# Patient Record
Sex: Female | Born: 1990 | Race: Black or African American | Hispanic: No | Marital: Single | Smoking: Never smoker
Health system: Southern US, Community
[De-identification: ages and names within clinical notes are randomized; demographics above are authoritative.]

## PROBLEM LIST (undated history)

## (undated) DIAGNOSIS — L989 Disorder of the skin and subcutaneous tissue, unspecified: Secondary | ICD-10-CM

## (undated) DIAGNOSIS — Z8619 Personal history of other infectious and parasitic diseases: Secondary | ICD-10-CM

## (undated) DIAGNOSIS — L309 Dermatitis, unspecified: Secondary | ICD-10-CM

## (undated) HISTORY — PX: WISDOM TOOTH EXTRACTION: SHX21

## (undated) HISTORY — DX: Dermatitis, unspecified: L30.9

## (undated) HISTORY — DX: Personal history of other infectious and parasitic diseases: Z86.19

---

## 2009-05-21 ENCOUNTER — Emergency Department (HOSPITAL_COMMUNITY): Admission: EM | Admit: 2009-05-21 | Discharge: 2009-05-21 | Payer: Self-pay | Admitting: Emergency Medicine

## 2012-04-26 ENCOUNTER — Encounter (HOSPITAL_COMMUNITY): Payer: Self-pay | Admitting: *Deleted

## 2012-04-26 ENCOUNTER — Emergency Department (HOSPITAL_COMMUNITY)
Admission: EM | Admit: 2012-04-26 | Discharge: 2012-04-26 | Discharge status: Against Medical Advice | Payer: Self-pay | Attending: Emergency Medicine | Admitting: Emergency Medicine

## 2012-04-26 DIAGNOSIS — R109 Unspecified abdominal pain: Secondary | ICD-10-CM | POA: Insufficient documentation

## 2012-04-26 LAB — URINALYSIS, ROUTINE W REFLEX MICROSCOPIC
Glucose, UA: NEGATIVE mg/dL
Nitrite: NEGATIVE
Specific Gravity, Urine: 1.016 (ref 1.005–1.030)
pH: 6 (ref 5.0–8.0)

## 2012-04-26 LAB — URINE MICROSCOPIC-ADD ON

## 2012-04-26 NOTE — ED Notes (Signed)
Patient not in waiting room  

## 2012-04-26 NOTE — ED Notes (Addendum)
Pt states she woke up with right sided upper abdominal pain. Pt states that when she laughs, coughs pain gets worse. Pt states she able to have bowel movement and no problems with urinate. Pt state denies pain in back just abdomen. Pt also requesting STI testing.

## 2012-04-26 NOTE — ED Notes (Signed)
Patient not in waiting room at this time.

## 2012-04-27 NOTE — ED Provider Notes (Signed)
Pt left [facility]a prior to my evaluation  Olivia Mackie, MD 04/27/12 [phone]

## 2012-07-07 ENCOUNTER — Emergency Department (HOSPITAL_COMMUNITY)
Admission: EM | Admit: 2012-07-07 | Discharge: 2012-07-07 | Disposition: A | Discharge status: Home / Self Care | Payer: Self-pay | Attending: Emergency Medicine | Admitting: Emergency Medicine

## 2012-07-07 ENCOUNTER — Encounter (HOSPITAL_COMMUNITY): Payer: Self-pay

## 2012-07-07 DIAGNOSIS — M436 Torticollis: Secondary | ICD-10-CM | POA: Insufficient documentation

## 2012-07-07 DIAGNOSIS — Z825 Family history of asthma and other chronic lower respiratory diseases: Secondary | ICD-10-CM | POA: Insufficient documentation

## 2012-07-07 HISTORY — DX: Disorder of the skin and subcutaneous tissue, unspecified: L98.9

## 2012-07-07 MED ORDER — METHOCARBAMOL 500 MG PO TABS: 1000.0000 mg | ORAL_TABLET | Freq: Four times a day (QID) | ORAL | Status: DC

## 2012-07-07 MED ORDER — IBUPROFEN 800 MG PO TABS: 800.0000 mg | ORAL_TABLET | Freq: Three times a day (TID) | ORAL | Status: DC | PRN

## 2012-07-07 NOTE — ED Provider Notes (Signed)
History     CSN: [id number]  Arrival date & time 07/07/12  1727   First MD Initiated Contact with Patient 07/07/12 2037      Chief Complaint  Patient presents with  . Neck Pain  . Back Pain    (Consider location/radiation/quality/duration/timing/severity/associated sxs/prior treatment) HPI Comments: Patient with gradual onset of neck stiffness yesterday that has gradually worsened today. Patient denies injury at the onset. Patient is a Therapist, art and lifts patients. Patient's mother gave her a Flexeril and Lidoderm patch which has not helped. She's also been taking ibuprofen and using heat packs. Patient denies fever. She is able to look left and right with stiffness. She complains of slight headache. No vision changes. No nausea or vomiting.  The history is provided by the patient.    Past Medical History  Diagnosis Date  . Skin disease     History reviewed. No pertinent past surgical history.  Family History  Problem Relation Age of Onset  . Asthma Mother     History  Substance Use Topics  . Smoking status: Never Smoker   . Smokeless tobacco: Never Used  . Alcohol Use: Yes     1-2 times a week    OB History    Grav Para Term Preterm Abortions TAB SAB Ect Mult Living                  Review of Systems  Constitutional: Negative for fever.  HENT: Positive for neck pain and neck stiffness. Negative for sore throat.   Eyes: Negative for visual disturbance.  Gastrointestinal: Negative for nausea and vomiting.  Skin: Negative for rash.  Neurological: Positive for headaches.    Allergies  Review of patient's allergies indicates no known allergies.  Home Medications   Current Outpatient Rx  Name Route Sig Dispense Refill  . CYCLOBENZAPRINE HCL 10 MG PO TABS Oral Take 10 mg by mouth once.    . IBUPROFEN 200 MG PO TABS Oral Take 400 mg by mouth every 6 (six) hours as needed. Pain.      BP 115/74  Pulse 95  Temp 98.5 F (36.9 C) (Oral)  Resp 16  SpO2 100%   LMP 06/18/2012  Physical Exam  Nursing note and vitals reviewed. Constitutional: She appears well-developed and well-nourished.  HENT:  Head: Normocephalic and atraumatic.  Eyes: Conjunctivae normal are normal. Pupils are equal, round, and reactive to light.  Neck: Neck supple.       Mild decrease in lateral rotation, bending, flexion/extension. No meningeal signs.   Pulmonary/Chest: Effort normal.  Abdominal: Soft. There is no tenderness. There is no CVA tenderness.  Musculoskeletal: Normal range of motion.       Cervical back: She exhibits tenderness. She exhibits no bony tenderness.       Thoracic back: She exhibits no bony tenderness.       Back:       No step-off noted with palpation of spine.   Neurological: She is alert. She has normal strength and normal reflexes. No sensory deficit.       5/5 strength in entire lower extremities bilaterally. No sensation deficit.   Skin: Skin is warm and dry. No rash noted.  Psychiatric: She has a normal mood and affect.    ED Course  Procedures (including critical care time)  Labs Reviewed - No data to display No results found.   1. Torticollis     8:38 PM Patient seen and examined.    Vital signs reviewed  and are as follows: Filed Vitals:   07/07/12 1822  BP: 115/74  Pulse: 95  Temp: 98.5 F (36.9 C)  Resp: 16   Counseled on use of ibuprofen.  Patient counseled on proper use of muscle relaxant medication.  They were told not to drink alcohol, drive any vehicle, or do any dangerous activities while taking this medication.  Patient verbalized understanding.    MDM  Patient with neck strain and spasm in neck and upper back. Patient is nontoxic, no fever, no indications of meningitis. Conservative management indicated. No neurological deficits and upper extremities.        Renne Crigler, [place] 07/07/12 2057

## 2012-07-07 NOTE — ED Notes (Signed)
Patient reports that she began having posterior neck, upper back and shoulder pain yesterday. Patient has taken Ibuprofen, Lidoderm patch, and a heat pack and a flexeril with no relief. Patient now c/o slight headache.

## 2012-07-07 NOTE — ED Notes (Signed)
[place], PA at bedside.  

## 2012-07-07 NOTE — ED Notes (Signed)
Family at bedside. 

## 2012-07-08 NOTE — ED Provider Notes (Signed)
Medical screening examination/treatment/procedure(s) were performed by non-physician practitioner and as supervising physician I was immediately available for consultation/collaboration.  Jones Skene, M.D.     Jones Skene, MD 07/08/12 [phone]

## 2013-01-07 ENCOUNTER — Ambulatory Visit (INDEPENDENT_AMBULATORY_CARE_PROVIDER_SITE_OTHER): Payer: Managed Care, Other (non HMO) | Admitting: Obstetrics & Gynecology

## 2013-01-07 ENCOUNTER — Encounter: Payer: Self-pay | Admitting: Obstetrics & Gynecology

## 2013-01-07 VITALS — BP 111/74 | HR 74 | Temp 97.9°F | Ht 65.0 in | Wt 142.0 lb

## 2013-01-07 DIAGNOSIS — Z3009 Encounter for other general counseling and advice on contraception: Secondary | ICD-10-CM | POA: Insufficient documentation

## 2013-01-07 DIAGNOSIS — R350 Frequency of micturition: Secondary | ICD-10-CM

## 2013-01-07 DIAGNOSIS — N760 Acute vaginitis: Secondary | ICD-10-CM

## 2013-01-07 DIAGNOSIS — N912 Amenorrhea, unspecified: Secondary | ICD-10-CM

## 2013-01-07 LAB — POCT URINALYSIS DIPSTICK
Bilirubin, UA: NEGATIVE
Ketones, UA: NEGATIVE
Protein, UA: NEGATIVE
Spec Grav, UA: 1.015
pH, UA: 6

## 2013-01-07 LAB — POCT WET PREP (WET MOUNT)

## 2013-01-07 MED ORDER — METRONIDAZOLE 500 MG PO TABS: 500.0000 mg | ORAL_TABLET | Freq: Two times a day (BID) | ORAL | Status: DC

## 2013-01-07 MED ORDER — NORETHINDRONE ACET-ETHINYL EST 1-20 MG-MCG PO TABS: 1.0000 | ORAL_TABLET | Freq: Every day | ORAL | Status: DC

## 2013-01-07 NOTE — Progress Notes (Signed)
Subjective:     Monique Harrington is a 22 y.o. female here for a routine exam.  Current complaints: malodorous vaginal discharge.  Personal health questionnaire reviewed: no.   Gynecologic History Patient's last menstrual period was 11/26/2012. Contraception: none  Obstetric History OB History   Grav Para Term Preterm Abortions TAB SAB Ect Mult Living                   The following portions of the patient's history were reviewed and updated as appropriate: allergies, current medications, past family history, past medical history, past social history, past surgical history and problem list.  Review of Systems Pertinent items are noted in HPI.    Objective:    Pelvic: cervix normal in appearance, external genitalia normal, no adnexal masses or tenderness, uterus normal size, shape, and consistency and thin white vaginal discharge    Assessment:   Bacterial vaginosis    Plan:    Follow up in: several months.   Start COCP

## 2013-01-07 NOTE — Patient Instructions (Signed)
Patient information: Abnormal uterine bleeding (Beyond the Basics)  Author Collene Leyden, MD Section Editor Lysbeth Galas, MD Deputy Editor Morton Amy, MD Disclosures  All topics are updated as new evidence becomes available and our peer review process is complete.  Literature review current through: Mar 2014.  This topic last updated: Nov 11, 2012.  INTRODUCTION - The inside of the uterus has two layers. The thin inner layer is called the endometrium. The thick outer layer is the myometrium (myo = muscle) (figure 1). Menstruation occurs 10 to 14 days after ovulation. In women who ovulate and menstruate regularly, the endometrium thickens every month in preparation for pregnancy. If the woman does not become pregnant, the endometrial lining is shed during the menstrual period. After menopause, the lining normally stops growing and shedding. Under normal circumstances, a woman's uterus sheds a limited amount of blood during each menstrual period (less than 5 tablespoons or 80 mL). Bleeding that occurs erratically or excessive regular menstrual bleeding is considered to be abnormal uterine bleeding. Once a woman who is not taking hormone therapy enters menopause and the menstrual cycles have ended, any uterine bleeding is considered abnormal. Abnormal uterine bleeding can be caused by many different conditions. This topic review discusses the possible causes of abnormal bleeding, how it is evaluated, and various treatment strategies that may be recommended. CAUSES OF ABNORMAL UTERINE BLEEDING - Most conditions that cause abnormal uterine bleeding can occur at any age, but some are more likely to occur at a particular time in a woman's life. Abnormal uterine bleeding in young girls - Bleeding before menarche (the first period in a girl's life) is always abnormal. It may be caused by trauma, a foreign body (such as toys, coins, or toilet tissue), irritation of the genital area (due to bubble  bath, soaps, lotions, or infection), or urinary tract problems. Bleeding can also occur as a result of sexual abuse. Adolescents - Many girls have episodes of irregular bleeding during the first few months after their first menstrual period. This usually resolves without treatment when the girl's hormonal cycle and ovulation normalizes. If bleeding persists beyond this time, or if the bleeding is heavy, further evaluation is needed. Abnormal bleeding in this age group can also be caused by any of the conditions that cause bleeding in all premenopausal women, including: pregnancy, infection, and bleeding disorder or other medical illnesses. These and other causes are discussed in the next section. Premenopausal women - Many different conditions can cause abnormal bleeding in women between adolescence and menopause. Abrupt changes in hormone levels at the time of ovulation can cause vaginal spotting, or small amounts of bleeding. Breakthrough bleeding can also occur in premenopausal women who use hormonal birth control methods. Some women do not ovulate regularly and may experience unpredictable light or heavy vaginal bleeding. Although anovulation is most common when periods first begin and during perimenopause, it can occur at any time during the reproductive years. (See "Patient information: Absent or irregular periods (Beyond the Basics)".) Some women who ovulate normally experience excessive blood loss during their periods or bleed between periods. The most common causes of such bleeding are uterine fibroids, uterine adenomyosis, or endometrial polyps. Fibroids are benign masses in the muscle layer of the uterus (myometrium), while adenomyosis is a condition in which the lining of the uterus (endometrium) grows into the myometrium. Endometrial polyps are fleshy (usually benign) growths of tissue which project into the uterine cavity. These conditions are common causes of abnormal uterine bleeding.  Fibroids,  adenomyosis and polyps can also occur in anovulatory women. (See "Patient information: Uterine fibroids (Beyond the Basics)" and "Patient information: Heavy or prolonged periods (menorrhagia) (Beyond the Basics)".) Other causes of abnormal uterine bleeding in premenopausal women include: ?Pregnancy ?Cancer or precancer of the cervix or the endometrium (lining of the uterus) (see "Patient information: Endometrial cancer diagnosis and staging (Beyond the Basics)") ?Infection or inflammation of the cervix or endometrium ?Clotting disorders such as von Willebrand disease, platelet abnormalities, or problems with clotting factors ?Medical illnesses such as hypothyroidism, liver disease, or chronic renal disease Hormonal birth control - Girls and women who use hormonal birth control (eg, pills, ring, patch) may experience "breakthrough" bleeding between periods. If this occurs during the first few months, it may be due to changes in the lining of the uterus. If it persists for more than a few months, evaluation may be needed and/or a different birth control pill may be recommended. (See "Patient information: Hormonal methods of birth control (Beyond the Basics)".) Breakthrough bleeding can also happen if a hormonal birth control method is forgotten or taken late. In this situation, there is a risk that the woman could become pregnant if she has sex. Another form of birth control (eg, condoms) is recommended if the pill/patch/shot is not taken on time. Women in the menopausal transition - Before the menstrual periods end, a woman passes through a period called the menopausal transition or perimenopause. During the menopausal transition, normal hormonal cycling begins to change and ovulation may be inconsistent. While estrogen secretion continues, progesterone secretion declines. These hormonal changes can cause the endometrium to grow and produce excess tissue, increasing the chances that polyps or endometrial  hyperplasia (thickened lining of the uterus) will develop and potentially cause abnormal bleeding. The menopausal transition is a time when women are more likely to experience abnormal uterine bleeding. Women in the menopausal transition are also at risk for other conditions that cause abnormal bleeding, including cancer, infection, and body-wide (systemic) illnesses. Further evaluation is needed in women with persistent irregular menstrual cycles or an episode of profuse bleeding. Women in the menopausal transition still ovulate some of the time and can become pregnant; pregnancy can cause abnormal bleeding. In addition, women in perimenopause may use hormonal birth control medications, which can cause breakthrough bleeding. Menopausal women - A number of conditions can cause abnormal bleeding during the menopause. Women who take hormone therapy may experience cyclical bleeding. Any other bleeding that occurs during menopause is abnormal and should be investigated. Causes of abnormal bleeding during menopause include: ?Atrophy (excessive thinning) of the tissue lining the vagina and uterus ?Cancer or precancerous changes (hyperplasia) of the uterine lining (endometrium) (see "Patient information: Endometrial cancer diagnosis and staging (Beyond the Basics)") ?Polyps or fibroids ?Infection of the uterus ?Use of blood thinners or anticoagulants ?Side effects of radiation therapy ABNORMAL UTERINE BLEEDING EVALUATION Initial assessment - While taking a woman's medical history, a clinician will review the duration and amount of bleeding; factors that seem to bring the bleeding on; symptoms that occur along with the bleeding such as pain, fever, or vaginal odor; if bleeding occurs after sexual intercourse; whether there is a personal or family history of bleeding disorders; the woman's medical history and medications she is taking; recent weight changes, stress, a new exercise program, or underlying medical  problems. The clinician will perform a physical examination to evaluate the woman's overall health, and a pelvic examination to confirm that the bleeding is from the uterus and not from  another site (eg, the external genitals or rectum). During the pelvic exam, the clinician will look for any obvious lesions (cuts, sores, or tumors) and will examine the size and shape of the uterus. He or she will examine the cervix to look for signs of cervical bleeding, and a Pap smear/human papillomavirus (HPV) test may be obtained to screen for cervical cancer (the cervix is at the lower end of the uterus, where it opens to the vagina). (See "Patient information: Cervical cancer screening (Beyond the Basics)".) Lab tests - In premenopausal women, a pregnancy test is performed. If there is any abnormal vaginal discharge, a cervical culture may be performed. Blood tests may also be done to determine if there are problems with blood clotting or other body-wide conditions, such as thyroid disease, liver disease, or kidney problems. Tests to determine ovulatory status - Because hormonal irregularities can contribute to abnormal uterine bleeding, testing may be recommended to determine if the woman ovulates (produce an egg) during each monthly cycle. Endometrial assessment - Tests that assess the endometrium (lining of the uterus) may be performed to rule out endometrial cancer and structural abnormalities such as uterine fibroids or polyps. Such tests include: Endometrial biopsy - An endometrial biopsy is often performed in women age 31 or older to rule out endometrial cancer or abnormal endometrial growths. A biopsy may also be performed in women younger than 45 years if they have risk factors for endometrial cancer or are felt to be at increased risk for an infection of the endometrium. Risks for endometrial cancer include obesity, chronic anovulation, history of breast cancer, tamoxifen use or a family history of breast  cancer or colon cancer. (See "Patient information: Endometrial cancer diagnosis and staging (Beyond the Basics)".) During the biopsy, a thin instrument is inserted through the vagina into the uterus to obtain a small sample of endometrial tissue. The biopsy (which often causes temporary severe uterine cramping) can be performed in a healthcare provider's office without anesthesia. Because only a small portion of the endometrium is sampled, the biopsy may miss some causes of bleeding and other tests are sometimes necessary. Transvaginal ultrasound - An ultrasound uses sound waves to measure an organ's shape and structure. In a transvaginal ultrasound, an ultrasound probe is inserted into the vagina so that it is closer to the uterus and can provide a clear image of the uterus. The lining of the uterus is evaluated and measured; postmenopausal women normally have a very thin endometrial lining (usually less than 4 or 5 mm). Ultrasound cannot distinguish between different types of abnormalities (eg, polyp versus cancer) and further testing may be necessary. Saline infusion sonography (sonohysterography) - In this test, a transvaginal ultrasound is performed after sterile saline is instilled into the uterus. This procedure gives a better picture of the inside of the uterus, and small lesions can be more easily detected. However, because tissue samples cannot be obtained during the procedure, a final diagnosis is not always possible and additional evaluation, usually including hysteroscopy with dilation and curettage (D&C) may be necessary. Hysteroscopy - During hysteroscopy, a small scope is inserted through the cervix and into the uterus. Air or fluid is injected to expand the uterus and to allow the physician to see the inside of the uterus. Tissue samples may be taken. Anesthesia may be used to minimize discomfort during the procedure. Hysteroscopy may be performed in the office or in a same-day surgery in an  operating room. Dilation and curettage (D&C) - In a D&C, the  cervix or opening of the uterus is dilated and instruments are inserted and used to remove endometrial or uterine tissue. A D&C usually requires anesthesia. It can sometimes be used as a treatment for prolonged or excessive bleeding that is due to hormonal changes and that is unresponsive to other treatments. (See "Patient information: Dilation and curettage (D and C) (Beyond the Basics)".) ABNORMAL UTERINE BLEEDING TREATMENT - The treatment of abnormal bleeding is based upon the underlying cause. Birth control pills - Birth control pills are often used to treat uterine bleeding that is due to hormonal changes or hormonal irregularities. Birth control pills may be used in women who do not ovulate regularly to establish regular bleeding cycles and prevent excessive growth of the endometrium. In women who do ovulate, they may be used to treat excessive menstrual bleeding. Nonsteroidal anti-inflammatory drugs (NSAIDS, eg ibuprofen, naproxen sodium) may also be helpful in reducing blood loss and cramping in these women. During the menopausal transition, birth control pills or other hormonal therapy may be used to regulate the menstrual cycle and prevent excessive growth of the endometrium. (See "Patient information: Heavy or prolonged periods (menorrhagia) (Beyond the Basics)".) Progesterone - Progesterone is a hormone made by the ovary that is effective in preventing excessive bleeding in women who do not ovulate regularly. A synthetic form of progesterone, called progestin, may be recommended to treat abnormal bleeding. Progestins are usually given as pills (eg, medroxyprogesterone acetate, norethindrone), and are taken once a day for 10 to 12 days each month or two, or taken continuously (every day). In women taking monthly cyclical progestin therapy, vaginal bleeding will begin before the seventh day of progestin treatment if the uterine lining is  overgrown; otherwise, it may not be seen until several days after the last progestin tablet is taken. In some cases, the progestin is given on a regular basis (eg, every few months) to prevent excessive growth of the uterine lining and heavy menstrual bleeding. If no bleeding is seen after progestin treatment, the possibility of an unintended pregnancy should be explored. Progestins may also be given in other ways, such as in an injection, an implant, or an intrauterine device. These treatments are discussed in detail in a separate topic review. (See "Patient information: Heavy or prolonged periods (menorrhagia) (Beyond the Basics)".) Intrauterine device - An intrauterine contraceptive device (IUD) that secretes progestin (eg, Mirena) may be recommended for women who have abnormal uterine bleeding. IUDs are T-shaped devices inserted by a healthcare provider through the vagina and cervix into the uterus. IUDs include an attached plastic string that projects through the cervix, enabling the woman to check that the device remains in place (picture 1). Progestin-releasing IUDs decrease menstrual blood loss by 40 to 50 percent and decrease pain associated with periods. Some women completely stop having menstrual bleeding as a result of the IUD, which is reversible when the IUD is removed. (See "Patient information: Long-term methods of birth control (Beyond the Basics)".) Surgery - Surgery may be necessary to remove abnormal uterine structures (eg, fibroids, polyps). Women who have completed childbearing and have heavy menstrual bleeding can consider a surgical procedure such as endometrial ablation. This procedure may be performed in a gynecologist's office or in an operating room as a same-day surgery, and uses heat, cold, or a laser to destroy the lining of the uterus. More information about endometrial ablation is available in a separate topic review. (See "Patient information: Heavy or prolonged periods  (menorrhagia) (Beyond the Basics)".) Women with fibroids can have  surgical treatment of their fibroids, either by removing the fibroid(s) (eg, myomectomy) or by reducing the blood supply of the fibroids (eg, uterine artery embolization). The most definitive surgical treatment for abnormal uterine bleeding is hysterectomy, or removal of the entire uterus. At the time of hysterectomy, the ovaries may be left in place or removed. Hysterectomy may be performed by conventional laparoscopy or robotic laparoscopy ("belly button surgery"), through the vagina, or by an open incision on the abdomen. More information about these treatments is available separately. (See "Patient information: Uterine fibroids (Beyond the Basics)".) WHERE TO GET MORE INFORMATION - Your healthcare provider is the best source of information for questions and concerns related to your medical problem.

## 2013-03-23 ENCOUNTER — Other Ambulatory Visit: Payer: Self-pay | Admitting: Occupational Medicine

## 2013-03-23 ENCOUNTER — Ambulatory Visit
Admission: RE | Admit: 2013-03-23 | Discharge: 2013-03-23 | Disposition: A | Discharge status: Home / Self Care | Payer: No Typology Code available for payment source | Source: Ambulatory Visit | Attending: Occupational Medicine | Admitting: Occupational Medicine

## 2013-03-23 DIAGNOSIS — Z021 Encounter for pre-employment examination: Secondary | ICD-10-CM

## 2013-04-06 ENCOUNTER — Ambulatory Visit: Payer: Self-pay | Admitting: Obstetrics & Gynecology

## 2013-04-08 ENCOUNTER — Ambulatory Visit: Payer: Managed Care, Other (non HMO) | Admitting: Obstetrics & Gynecology

## 2013-09-30 ENCOUNTER — Emergency Department (HOSPITAL_COMMUNITY)
Admission: EM | Admit: 2013-09-30 | Discharge: 2013-09-30 | Disposition: A | Discharge status: Home / Self Care | Payer: 59 | Source: Home / Self Care | Attending: Family Medicine | Admitting: Family Medicine

## 2013-09-30 ENCOUNTER — Encounter (HOSPITAL_COMMUNITY): Payer: Self-pay | Admitting: Emergency Medicine

## 2013-09-30 DIAGNOSIS — J039 Acute tonsillitis, unspecified: Secondary | ICD-10-CM

## 2013-09-30 MED ORDER — AMOXICILLIN-POT CLAVULANATE 875-125 MG PO TABS: 1.0000 | ORAL_TABLET | Freq: Two times a day (BID) | ORAL | Status: DC

## 2013-09-30 MED ORDER — FLUCONAZOLE 150 MG PO TABS: 150.0000 mg | ORAL_TABLET | Freq: Once | ORAL | Status: DC

## 2013-09-30 NOTE — ED Provider Notes (Signed)
Monique Harrington is a 22 y.o. female who presents to Urgent Care today for sore throat for the past 4 or 5 days. This is associated with left ear pain and left-sided cervical neck pain. No nausea vomiting diarrhea. No trouble breathing or chest pain. Patient had initial fever. She's tried multiple over-the-counter medications which will help a little. She feels well otherwise. She notes that with antibiotic she typically develops yeast infections.   Past Medical History  Diagnosis Date  . Skin disease   . Eczema   . History of chlamydia    History  Substance Use Topics  . Smoking status: Never Smoker   . Smokeless tobacco: Never Used  . Alcohol Use: Yes     Comment: 1-2 times a week   ROS as above Medications reviewed. No current facility-administered medications for this encounter.   Current Outpatient Prescriptions  Medication Sig Dispense Refill  . amoxicillin-clavulanate (AUGMENTIN) 875-125 MG per tablet Take 1 tablet by mouth every 12 (twelve) hours.  14 tablet  0  . fluconazole (DIFLUCAN) 150 MG tablet Take 1 tablet (150 mg total) by mouth once.  1 tablet  1  . norethindrone-ethinyl estradiol (MICROGESTIN,JUNEL,LOESTRIN) 1-20 MG-MCG tablet Take 1 tablet by mouth daily. Start in 3 weeks  1 Package  11    Exam:  BP 124/81  Pulse 78  Temp(Src) 98.2 F (36.8 C) (Oral)  Resp 18  SpO2 98%  LMP 08/27/2013 Gen: Well NAD HEENT: EOMI,  MMM, left tonsil hypertrophy erythema with exudate. Right is normal. Tympanic membranes are normal appearing bilaterally. Patient has mildly tender left-sided anterior cervical lymphadenopathy. Neck range of motion is normal Lungs: Normal work of breathing. CTABL Heart: RRR no MRG Exts: warm and well perfused.   Results for orders placed during the hospital encounter of 09/30/13 (from the past 24 hour(s))  POCT RAPID STREP A ([facility] ONLY)     Status: Abnormal   Collection Time    09/30/13  6:04 PM      Result Value Range   Streptococcus,  Group A Screen (Direct) POSITIVE (*) NEGATIVE   No results found.  Assessment and Plan: 22 y.o. female with tonsillitis. Plan to treat with Augmentin. Additionally recommend NSAIDs for pain control. I have prescribed fluconazole case patient develops yeast infection. Discussed warning signs or symptoms. Please see discharge instructions. Patient expresses understanding.      Rodolph Bong, MD 09/30/13 [phone]

## 2013-09-30 NOTE — ED Notes (Signed)
C/o cold sx States she has a sore throat States last week she has had a fever Pain radiating to ear.

## 2013-10-01 ENCOUNTER — Emergency Department (HOSPITAL_COMMUNITY)
Admission: EM | Admit: 2013-10-01 | Discharge: 2013-10-01 | Disposition: A | Discharge status: Home / Self Care | Payer: 59 | Attending: Emergency Medicine | Admitting: Emergency Medicine

## 2013-10-01 ENCOUNTER — Emergency Department (HOSPITAL_COMMUNITY): Payer: 59

## 2013-10-01 ENCOUNTER — Encounter (HOSPITAL_COMMUNITY): Payer: Self-pay | Admitting: Emergency Medicine

## 2013-10-01 DIAGNOSIS — Z872 Personal history of diseases of the skin and subcutaneous tissue: Secondary | ICD-10-CM | POA: Insufficient documentation

## 2013-10-01 DIAGNOSIS — Z792 Long term (current) use of antibiotics: Secondary | ICD-10-CM | POA: Insufficient documentation

## 2013-10-01 DIAGNOSIS — Z79899 Other long term (current) drug therapy: Secondary | ICD-10-CM | POA: Insufficient documentation

## 2013-10-01 DIAGNOSIS — Z8619 Personal history of other infectious and parasitic diseases: Secondary | ICD-10-CM | POA: Insufficient documentation

## 2013-10-01 DIAGNOSIS — J36 Peritonsillar abscess: Secondary | ICD-10-CM | POA: Insufficient documentation

## 2013-10-01 LAB — POCT I-STAT, CHEM 8
BUN: 8 mg/dL (ref 6–23)
CALCIUM ION: 1.25 mmol/L — AB (ref 1.12–1.23)
CHLORIDE: 102 meq/L (ref 96–112)
Creatinine, Ser: 0.9 mg/dL (ref 0.50–1.10)
Glucose, Bld: 88 mg/dL (ref 70–99)
HEMATOCRIT: 42 % (ref 36.0–46.0)
Hemoglobin: 14.3 g/dL (ref 12.0–15.0)
Potassium: 3.8 mEq/L (ref 3.7–5.3)
Sodium: 140 mEq/L (ref 137–147)
TCO2: 24 mmol/L (ref 0–100)

## 2013-10-01 MED ORDER — IOHEXOL 300 MG/ML  SOLN
75.0000 mL | Freq: Once | INTRAMUSCULAR | Status: AC | PRN
  Administered 2013-10-01: 75 mL via INTRAVENOUS

## 2013-10-01 MED ORDER — MORPHINE SULFATE 4 MG/ML IJ SOLN
4.0000 mg | Freq: Once | INTRAMUSCULAR | Status: AC
  Administered 2013-10-01: 4 mg via INTRAVENOUS
  Filled 2013-10-01: qty 1

## 2013-10-01 MED ORDER — ONDANSETRON HCL 4 MG/2ML IJ SOLN
4.0000 mg | Freq: Once | INTRAMUSCULAR | Status: AC
  Administered 2013-10-01: 4 mg via INTRAVENOUS
  Filled 2013-10-01: qty 2

## 2013-10-01 MED ORDER — HYDROMORPHONE HCL PF 1 MG/ML IJ SOLN
1.0000 mg | Freq: Once | INTRAMUSCULAR | Status: AC
  Administered 2013-10-01: 1 mg via INTRAVENOUS
  Filled 2013-10-01: qty 1

## 2013-10-01 MED ORDER — DEXAMETHASONE SODIUM PHOSPHATE 10 MG/ML IJ SOLN
10.0000 mg | Freq: Once | INTRAMUSCULAR | Status: AC
  Administered 2013-10-01: 10 mg via INTRAVENOUS
  Filled 2013-10-01: qty 1

## 2013-10-01 MED ORDER — HYDROCODONE-ACETAMINOPHEN 7.5-325 MG/15ML PO SOLN: 15.0000 mL | Freq: Four times a day (QID) | ORAL | Status: DC | PRN

## 2013-10-01 MED ORDER — KETOROLAC TROMETHAMINE 30 MG/ML IJ SOLN
INTRAMUSCULAR | Status: AC
  Filled 2013-10-01: qty 1

## 2013-10-01 MED ORDER — KETOROLAC TROMETHAMINE 30 MG/ML IJ SOLN
30.0000 mg | Freq: Once | INTRAMUSCULAR | Status: AC
  Administered 2013-10-01: 30 mg via INTRAVENOUS
  Filled 2013-10-01: qty 1

## 2013-10-01 MED ORDER — SODIUM CHLORIDE 0.9 % IV BOLUS (SEPSIS)
1000.0000 mL | Freq: Once | INTRAVENOUS | Status: AC
  Administered 2013-10-01: 1000 mL via INTRAVENOUS

## 2013-10-01 NOTE — ED Notes (Signed)
Discussed discharge orders and persription with patient, pt. Understands. D/C home with family.

## 2013-10-01 NOTE — Discharge Instructions (Signed)
Please read and follow all provided instructions.  Your diagnoses today include:  1. Peritonsillar abscess     Tests performed today include:  CT scan shows 1.3cm x 0.9cm abscess next to your left tonsil.  Vital signs. See below for your results today.   Medications prescribed:   Lortab (hydrocodone/acetaminophen) - narcotic pain medication  DO NOT drive or perform any activities that require you to be awake and alert because this medicine can make you drowsy. BE VERY CAREFUL not to take multiple medicines containing Tylenol (also called acetaminophen). Doing so can lead to an overdose which can damage your liver and cause liver failure and possibly death.  Take any medications prescribed only as directed.   Home care instructions:  Please read the educational materials provided and follow any instructions contained in this packet.  Follow-up instructions: Please the throat doctor's office tomorrow morning at 9am. Tell them you were in the Emergency Department and have a peritonsillar abscess that needs drained. I spoke with Dr. Jenne PaneBates on the telephone today.   If you do not have a primary care doctor -- see below for referral information.   Return instructions:   Please return to the Emergency Department if you experience worsening symptoms.   Return if you have worsening problems swallowing, your neck becomes swollen, you cannot swallow your saliva or your voice becomes muffled.   Return with high persistent fever, persistent vomiting, or if you have trouble breathing.   Please return if you have any other emergent concerns.  Additional Information:  Your vital signs today were: BP 127/74   Pulse 63   Temp(Src) 98.3 F (36.8 C) (Oral)   Resp 14   SpO2 98%   LMP 08/27/2013 If your blood pressure (BP) was elevated above 135/85 this visit, please have this repeated by your doctor within one month. --------------

## 2013-10-01 NOTE — ED Provider Notes (Signed)
CSN: [id number]     Arrival date & time 10/01/13  [id number] History   First MD Initiated Contact with Patient 10/01/13 [phone]     Chief Complaint  Patient presents with  . Sore Throat   (Consider location/radiation/quality/duration/timing/severity/associated sxs/prior Treatment) HPI Comments: Patient presents with complaint of sore throat for the past 6 days. Yesterday her symptoms became more severe, more focused on the left side of her throat. Patient went to urgent care and was started on Augmentin. She returns today with severe pain. She had a fever at the onset but this resolves. She ate cheeseburger yesterday and is able to drink fluids. No other URI symptoms or cough. No other treatments prior to arrival. Onset of symptoms gradual. Course is worsening. Nothing makes symptoms better. Swallowing makes pain worse.  Patient is a 23 y.o. female presenting with pharyngitis. The history is provided by the patient, medical records and a parent.  Sore Throat Associated symptoms include a sore throat. Pertinent negatives include no abdominal pain, chills, congestion, coughing, fatigue, fever, headaches, myalgias, nausea, rash or vomiting.    Past Medical History  Diagnosis Date  . Skin disease   . Eczema   . History of chlamydia    Past Surgical History  Procedure Laterality Date  . Wisdom tooth extraction     Family History  Problem Relation Age of Onset  . Asthma Mother    History  Substance Use Topics  . Smoking status: Never Smoker   . Smokeless tobacco: Never Used  . Alcohol Use: Yes     Comment: 1-2 times a week   OB History   Grav Para Term Preterm Abortions TAB SAB Ect Mult Living                 Review of Systems  Constitutional: Negative for fever, chills and fatigue.  HENT: Positive for sore throat. Negative for congestion, ear pain, facial swelling, rhinorrhea and sinus pressure.   Eyes: Negative for redness.  Respiratory: Negative for cough and wheezing.    Gastrointestinal: Negative for nausea, vomiting, abdominal pain and diarrhea.  Genitourinary: Negative for dysuria.  Musculoskeletal: Negative for myalgias and neck stiffness.  Skin: Negative for rash.  Neurological: Negative for headaches.  Hematological: Negative for adenopathy.    Allergies  Review of patient's allergies indicates no known allergies.  Home Medications   Current Outpatient Rx  Name  Route  Sig  Dispense  Refill  . amoxicillin-clavulanate (AUGMENTIN) 875-125 MG per tablet   Oral   Take 1 tablet by mouth every 12 (twelve) hours.   14 tablet   0   . fluconazole (DIFLUCAN) 150 MG tablet   Oral   Take 1 tablet (150 mg total) by mouth once.   1 tablet   1   . norethindrone-ethinyl estradiol (MICROGESTIN,JUNEL,LOESTRIN) 1-20 MG-MCG tablet   Oral   Take 1 tablet by mouth daily. Start in 3 weeks   1 Package   11    BP 124/76  Pulse 78  Temp(Src) 98.3 F (36.8 C) (Oral)  Resp 16  SpO2 100%  LMP 08/27/2013 Physical Exam  Nursing note and vitals reviewed. Constitutional: She appears well-developed and well-nourished.  HENT:  Head: Normocephalic and atraumatic.  Right Ear: Hearing, tympanic membrane and ear canal normal.  Left Ear: Hearing, tympanic membrane and ear canal normal.  Nose: Nose normal.  Mouth/Throat: Mucous membranes are not dry. No trismus in the jaw. Tonsillar abscesses (left, early) present. No oropharyngeal exudate, posterior oropharyngeal edema or posterior  oropharyngeal erythema.  Eyes: Conjunctivae are normal. Right eye exhibits no discharge. Left eye exhibits no discharge.  Muffled voice  Neck: Normal range of motion. Neck supple.  Cardiovascular: Normal rate, regular rhythm and normal heart sounds.   Pulmonary/Chest: Effort normal and breath sounds normal.  Abdominal: Soft. There is no tenderness.  Lymphadenopathy:    She has cervical adenopathy.  Neurological: She is alert.  Skin: Skin is warm and dry.  Psychiatric: She has a  normal mood and affect.    ED Course  Procedures (including critical care time) Labs Review Labs Reviewed  POCT I-STAT, CHEM 8 - Abnormal; Notable for the following:    Calcium, Ion 1.25 (*)    All other components within normal limits   Imaging Review Ct Soft Tissue Neck W Contrast  10/01/2013   CLINICAL DATA:  Evaluate left peritonsillar abscess. Sore throat and fever with difficulty swallowing for 1 week.  EXAM: CT NECK WITH CONTRAST  TECHNIQUE: Multidetector CT imaging of the neck was performed using the standard protocol following the bolus administration of intravenous contrast.  CONTRAST:  75mL OMNIPAQUE IOHEXOL 300 MG/ML  SOLN  COMPARISON:  None.  FINDINGS: Orbits and sinuses are within normal. The spaces of the suprahyoid neck are notable for prominence of the peritonsillar tissues left worse than right with small left peritonsillar fluid collection/abscess measuring 1.3 x 0.9 cm in its AP and transverse dimension. There is mild reactive left cervical chain adenopathy with the largest node measuring 1.5 cm by short axis. Airways patent. There is subtle asymmetric enlargement of the left submandibular gland. Spaces of the infrahyoid neck are within normal. Visualize lungs are normal. The remainder of the exam is unremarkable.  IMPRESSION: Prominence of the peritonsillar soft tissues left greater than right with small focal fluid collection within the left peritonsillar tissues measuring 1.3 x 0.9 cm likely a small peritonsillar abscess. Mild associated reactive adenopathy of the left cervical chain. No evidence of airway compromise.   Electronically Signed   By: Elberta Fortis M.D.   On: 10/01/2013 11:24    EKG Interpretation   None      8:04 AM Patient seen and examined. Work-up initiated. Medications ordered. Suspect early PTA. Will CT neck to eval L PTA.   Vital signs reviewed and are as follows: Filed Vitals:   10/01/13 0747  BP: 124/76  Pulse: 78  Temp: 98.3 F (36.8 C)   Resp: 16   12:21 PM CT reviewed by myself. Spoke with Dr. Jenne Pane. Reccs decadron, continue Augmentin, have patient call office at [facility] tomorrow and have drainage performed in office tomorrow.   Patient and family updated. Additional pain meds prior to d/c.   Patient urged to return with worsening symptoms or other concerns. Patient verbalized understanding and agrees with plan.   Patient counseled on use of narcotic pain medications. Counseled not to combine these medications with others containing tylenol. Urged not to drink alcohol, drive, or perform any other activities that requires focus while taking these medications. The patient verbalizes understanding and agrees with the plan.  MDM   1. Peritonsillar abscess    Patient with L PTA, no airway compromise. Plan drainage tomorrow. She has been on Augmentin x 1 day. Steroids, pain control given.     Renne Crigler, PA-C 10/01/13 1223

## 2013-10-01 NOTE — ED Provider Notes (Signed)
Medical screening examination/treatment/procedure(s) were performed by non-physician practitioner and as supervising physician I was immediately available for consultation/collaboration.  EKG Interpretation   None         Charles B. Bernette MayersSheldon, MD 10/01/13 1226

## 2013-10-01 NOTE — ED Notes (Signed)
[facility]. [facility] and diagnosed with tonsillitis. Stated to EMS that it is hard to swallow and all she wants to do is spit. Lungs clear and rates pain 10/10. BP-110/80 HR-68

## 2014-02-15 ENCOUNTER — Ambulatory Visit: Payer: Managed Care, Other (non HMO) | Admitting: Obstetrics & Gynecology

## 2014-02-15 ENCOUNTER — Ambulatory Visit (INDEPENDENT_AMBULATORY_CARE_PROVIDER_SITE_OTHER): Payer: 59 | Admitting: Obstetrics & Gynecology

## 2014-02-15 ENCOUNTER — Encounter: Payer: Self-pay | Admitting: Obstetrics & Gynecology

## 2014-02-15 VITALS — BP 126/74 | HR 74 | Temp 98.4°F | Wt 138.0 lb

## 2014-02-15 DIAGNOSIS — Z0000 Encounter for general adult medical examination without abnormal findings: Secondary | ICD-10-CM

## 2014-02-15 DIAGNOSIS — Z01419 Encounter for gynecological examination (general) (routine) without abnormal findings: Secondary | ICD-10-CM

## 2014-02-15 DIAGNOSIS — IMO0001 Reserved for inherently not codable concepts without codable children: Secondary | ICD-10-CM

## 2014-02-15 MED ORDER — LEVONORGESTREL-ETHINYL ESTRAD 0.15-30 MG-MCG PO TABS: 1.0000 | ORAL_TABLET | Freq: Every day | ORAL | Status: AC

## 2014-02-15 NOTE — Patient Instructions (Signed)
Contraception Choices Contraception (birth control) is the use of any methods or devices to prevent pregnancy. Below are some methods to help avoid pregnancy. HORMONAL METHODS   Contraceptive implant This is a thin, plastic tube containing progesterone hormone. It does not contain estrogen hormone. Your health care provider inserts the tube in the inner part of the upper arm. The tube can remain in place for up to 3 years. After 3 years, the implant must be removed. The implant prevents the ovaries from releasing an egg (ovulation), thickens the cervical mucus to prevent sperm from entering the uterus, and thins the lining of the inside of the uterus.  Progesterone-only injections These injections are given every 3 months by your health care provider to prevent pregnancy. This synthetic progesterone hormone stops the ovaries from releasing eggs. It also thickens cervical mucus and changes the uterine lining. This makes it harder for sperm to survive in the uterus.  Birth control pills These pills contain estrogen and progesterone hormone. They work by preventing the ovaries from releasing eggs (ovulation). They also cause the cervical mucus to thicken, preventing the sperm from entering the uterus. Birth control pills are prescribed by a health care provider.Birth control pills can also be used to treat heavy periods.  Minipill This type of birth control pill contains only the progesterone hormone. They are taken every day of each month and must be prescribed by your health care provider.  Birth control patch The patch contains hormones similar to those in birth control pills. It must be changed once a week and is prescribed by a health care provider.  Vaginal ring The ring contains hormones similar to those in birth control pills. It is left in the vagina for 3 weeks, removed for 1 week, and then a new one is put back in place. The patient must be comfortable inserting and removing the ring from the  vagina.A health care provider's prescription is necessary.  Emergency contraception Emergency contraceptives prevent pregnancy after unprotected sexual intercourse. This pill can be taken right after sex or up to 5 days after unprotected sex. It is most effective the sooner you take the pills after having sexual intercourse. Most emergency contraceptive pills are available without a prescription. Check with your pharmacist. Do not use emergency contraception as your only form of birth control. BARRIER METHODS   Female condom This is a thin sheath (latex or rubber) that is worn over the penis during sexual intercourse. It can be used with spermicide to increase effectiveness.  Female condom. This is a soft, loose-fitting sheath that is put into the vagina before sexual intercourse.  Diaphragm This is a soft, latex, dome-shaped barrier that must be fitted by a health care provider. It is inserted into the vagina, along with a spermicidal jelly. It is inserted before intercourse. The diaphragm should be left in the vagina for 6 to 8 hours after intercourse.  Cervical cap This is a round, soft, latex or plastic cup that fits over the cervix and must be fitted by a health care provider. The cap can be left in place for up to 48 hours after intercourse.  Sponge This is a soft, circular piece of polyurethane foam. The sponge has spermicide in it. It is inserted into the vagina after wetting it and before sexual intercourse.  Spermicides These are chemicals that kill or block sperm from entering the cervix and uterus. They come in the form of creams, jellies, suppositories, foam, or tablets. They do not require a   prescription. They are inserted into the vagina with an applicator before having sexual intercourse. The process must be repeated every time you have sexual intercourse. INTRAUTERINE CONTRACEPTION  Intrauterine device (IUD) This is a T-shaped device that is put in a woman's uterus during a  menstrual period to prevent pregnancy. There are 2 types:  Copper IUD This type of IUD is wrapped in copper wire and is placed inside the uterus. Copper makes the uterus and fallopian tubes produce a fluid that kills sperm. It can stay in place for 10 years.  Hormone IUD This type of IUD contains the hormone progestin (synthetic progesterone). The hormone thickens the cervical mucus and prevents sperm from entering the uterus, and it also thins the uterine lining to prevent implantation of a fertilized egg. The hormone can weaken or kill the sperm that get into the uterus. It can stay in place for 3 5 years, depending on which type of IUD is used. PERMANENT METHODS OF CONTRACEPTION  Female tubal ligation This is when the woman's fallopian tubes are surgically sealed, tied, or blocked to prevent the egg from traveling to the uterus.  Hysteroscopic sterilization This involves placing a small coil or insert into each fallopian tube. Your doctor uses a technique called hysteroscopy to do the procedure. The device causes scar tissue to form. This results in permanent blockage of the fallopian tubes, so the sperm cannot fertilize the egg. It takes about 3 months after the procedure for the tubes to become blocked. You must use another form of birth control for these 3 months.  Female sterilization This is when the female has the tubes that carry sperm tied off (vasectomy).This blocks sperm from entering the vagina during sexual intercourse. After the procedure, the man can still ejaculate fluid (semen). NATURAL PLANNING METHODS  Natural family planning This is not having sexual intercourse or using a barrier method (condom, diaphragm, cervical cap) on days the woman could become pregnant.  Calendar method This is keeping track of the length of each menstrual cycle and identifying when you are fertile.  Ovulation method This is avoiding sexual intercourse during ovulation.  Symptothermal method This is  avoiding sexual intercourse during ovulation, using a thermometer and ovulation symptoms.  Post ovulation method This is timing sexual intercourse after you have ovulated. Regardless of which type or method of contraception you choose, it is important that you use condoms to protect against the transmission of sexually transmitted infections (STIs). Talk with your health care provider about which form of contraception is most appropriate for you. Document Released: 09/17/2005 Document Revised: 05/20/2013 Document Reviewed: 03/12/2013 [employer] Patient Information 2014 [employer].  

## 2014-02-15 NOTE — Progress Notes (Signed)
Subjective:     Monique Harrington is a 23 y.o. female here for a routine exam.   Personal health questionnaire:  Is patient Ashkenazi Jewish, have a family history of breast and/or ovarian cancer: no Is there a family history of uterine cancer diagnosed at age < 1950, gastrointestinal cancer, urinary tract cancer, family member who is a Personnel officerLynch syndrome-associated carrier: no Is the patient overweight and hypertensive, family history of diabetes, personal history of gestational diabetes or PCOS: no Is patient over 5955, have PCOS,  family history of premature CHD under age 565, diabetes, smoke, have hypertension or peripheral artery disease:  no   Gynecologic History Patient's last menstrual period was 01/27/2014. Contraception: none Last Pap: 2 yrs ago. Results were: normal   Obstetric History OB History  Gravida Para Term Preterm AB SAB TAB Ectopic Multiple Living  0 0 0 0 0 0 0 0 0 0         Past Medical History  Diagnosis Date  . Skin disease   . Eczema   . History of chlamydia     Past Surgical History  Procedure Laterality Date  . Wisdom tooth extraction      Current outpatient prescriptions:levonorgestrel-ethinyl estradiol (NORDETTE) 0.15-30 MG-MCG tablet, Take 1 tablet by mouth daily., Disp: 1 Package, Rfl: 11 No Known Allergies  History  Substance Use Topics  . Smoking status: Never Smoker   . Smokeless tobacco: Never Used  . Alcohol Use: Yes     Comment: 1-2 times a week    Family History  Problem Relation Age of Onset  . Asthma Mother       Review of Systems  Constitutional: negative for fatigue and weight loss Respiratory: negative for cough and wheezing Cardiovascular: negative for chest pain, fatigue and palpitations Gastrointestinal: negative for abdominal pain and change in bowel habits Musculoskeletal:negative for myalgias Neurological: negative for gait problems and tremors Behavioral/Psych: negative for abusive relationship, depression Endocrine:  negative for temperature intolerance   Genitourinary:negative for abnormal menstrual periods, genital lesions, hot flashes, sexual problems and vaginal discharge Integument/breast: negative for breast lump, breast tenderness, nipple discharge and skin lesion(s)    Objective:       BP 126/74  Pulse 74  Temp(Src) 98.4 F (36.9 C)  Wt 62.596 kg (138 lb)  LMP 01/27/2014 General:   alert  Skin:   no rash or abnormalities  Lungs:   clear to auscultation bilaterally  Heart:   regular rate and rhythm, S1, S2 normal, no murmur, click, rub or gallop  Breasts:   normal without suspicious masses, skin or nipple changes or axillary nodes  Abdomen:  normal findings: no organomegaly, soft, non-tender and no hernia  Pelvis:  External genitalia: normal general appearance Urinary system: urethral meatus normal and bladder without fullness, nontender Vaginal: normal without tenderness, induration or masses Cervix: normal appearance Adnexa: normal bimanual exam Uterus: anteverted and non-tender, normal size   Lab Review  Labs reviewed no Radiologic studies reviewed no     Assessment:    Healthy female exam.    Plan:    Education reviewed: depression evaluation, low fat, low cholesterol diet, safe sex/STD prevention and weight bearing exercise.  Considering a LARC Meds ordered this encounter  Medications  . DISCONTD: hydrOXYzine (ATARAX/VISTARIL) 10 MG tablet    Sig: Take 10 mg by mouth 3 (three) times daily as needed.  Marland Kitchen. levonorgestrel-ethinyl estradiol (NORDETTE) 0.15-30 MG-MCG tablet    Sig: Take 1 tablet by mouth daily.    Dispense:  1 Package  Refill:  11   Orders Placed This Encounter  Procedures  . HIV antibody  . RPR    Follow up as needed.

## 2014-02-16 LAB — HIV ANTIBODY (ROUTINE TESTING W REFLEX): HIV: NONREACTIVE

## 2014-02-16 LAB — RPR

## 2014-02-17 LAB — PAP IG AND CT-NG NAA
CHLAMYDIA PROBE AMP: NEGATIVE
GC PROBE AMP: NEGATIVE

## 2014-03-05 ENCOUNTER — Encounter: Payer: Self-pay | Admitting: *Deleted

## 2014-03-06 IMAGING — CT CT NECK W/ CM
4 of 6 series · 16 of 33 positions shown, 18 images · IV contrast (APPLIED)
Comparison: None.

CLINICAL DATA: Evaluate left peritonsillar abscess. Sore throat and
fever with difficulty swallowing for 1 week.

EXAM:
CT NECK WITH CONTRAST
TECHNIQUE: Multidetector CT imaging of the neck was performed using the
standard protocol following the bolus administration of intravenous
contrast.
CONTRAST:  75mL OMNIPAQUE IOHEXOL 300 MG/ML  SOLN

[Series 3: neck 2.0 i31s 3 · axial · 0.39mm/px · z∈[-190,-68]mm · 4 of 103 slices shown, 5 images]
[im 21/103  soft-tissue]
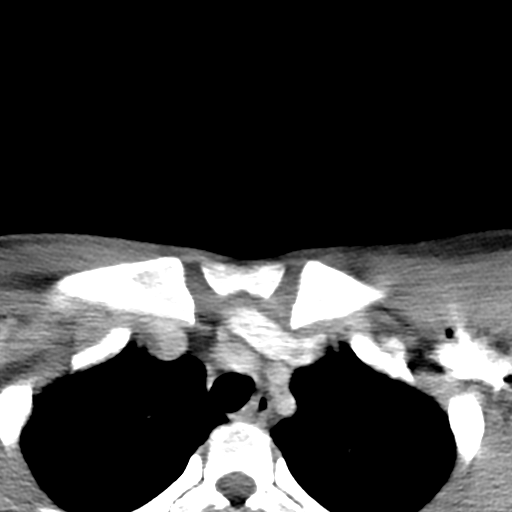
[im 21/103  bone]
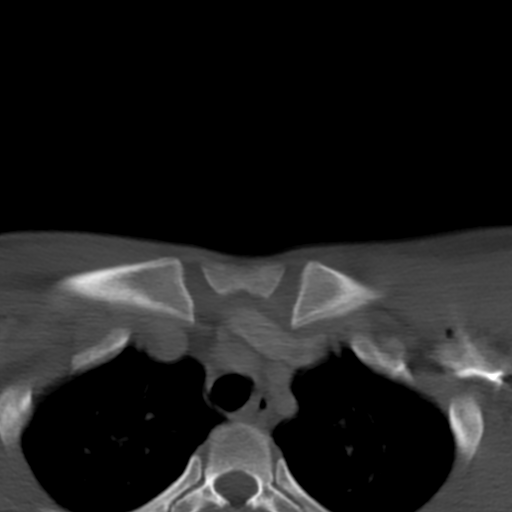
[im 41/103  bone]
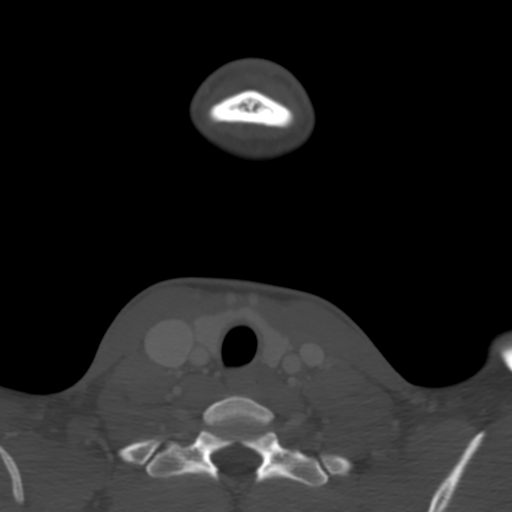
[im 62/103  bone]
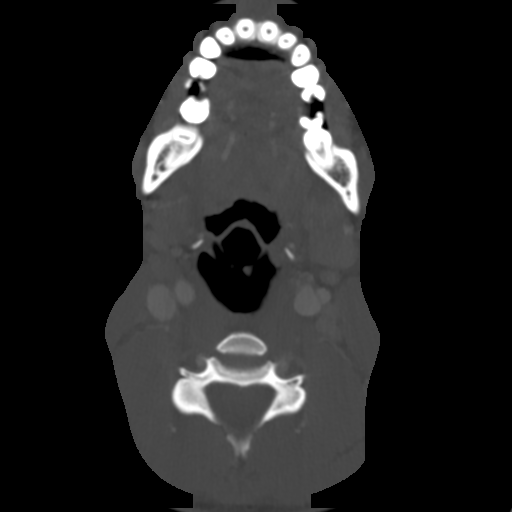
[im 82/103  bone]
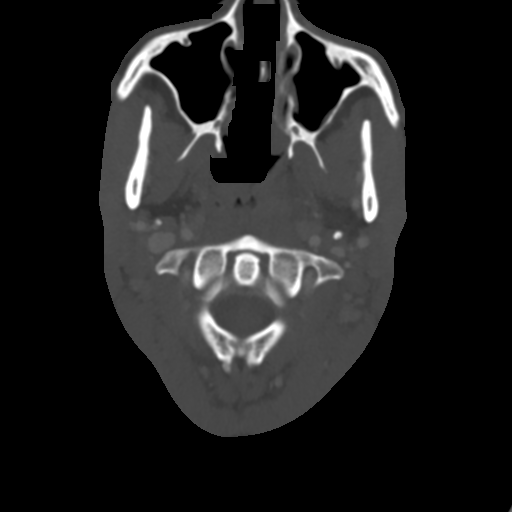

[Series 602: coronal · coronal · 0.44mm/px · 3 of 72 slices shown]
[im 17/72  bone]
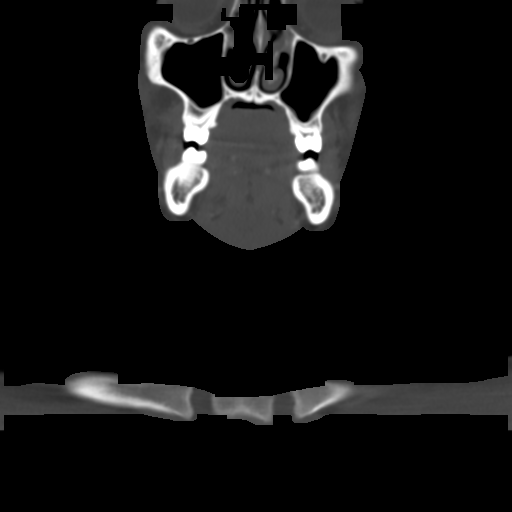
[im 38/72  bone]
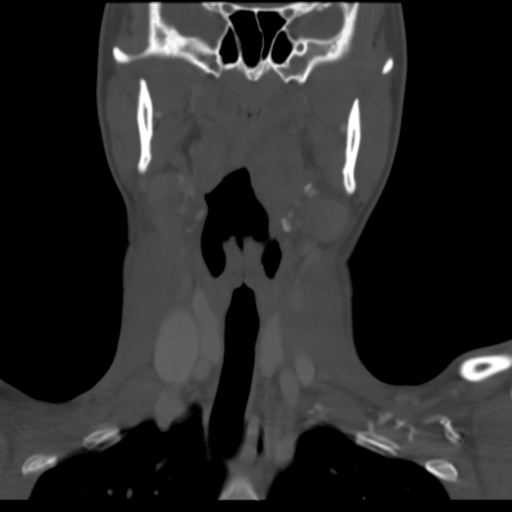
[im 59/72  bone]
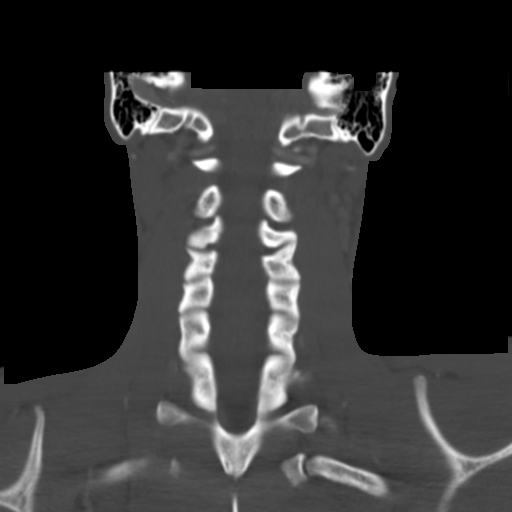

[Series 604: sagittal · sagittal · 0.44mm/px · 5 of 62 slices shown, 6 images]
[im 21/62  bone]
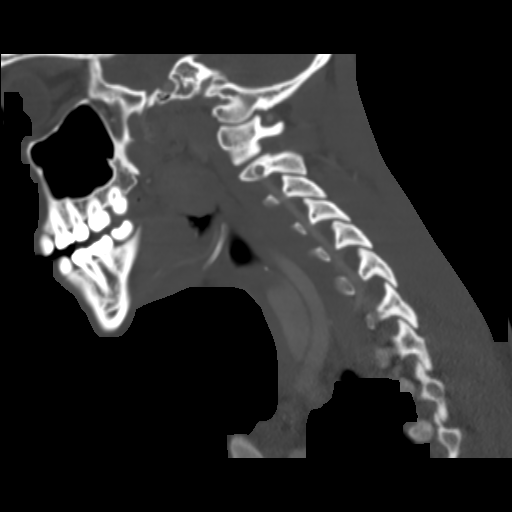
[im 26/62  bone]
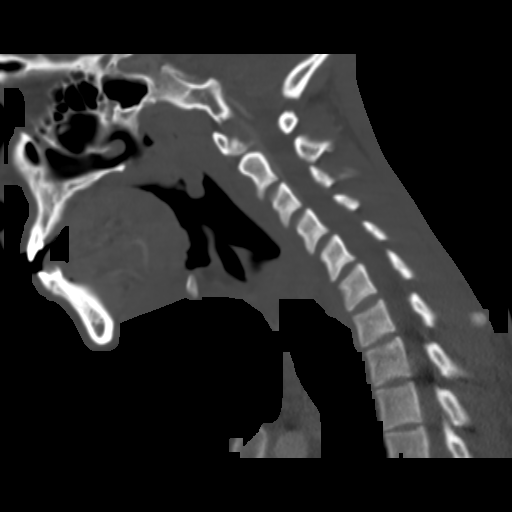
[im 31/62  soft-tissue]
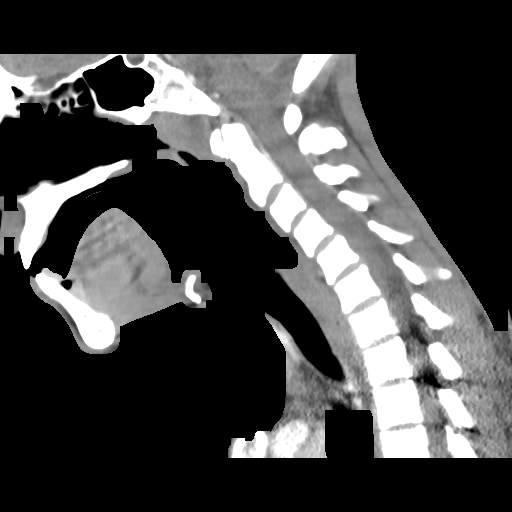
[im 31/62  bone]
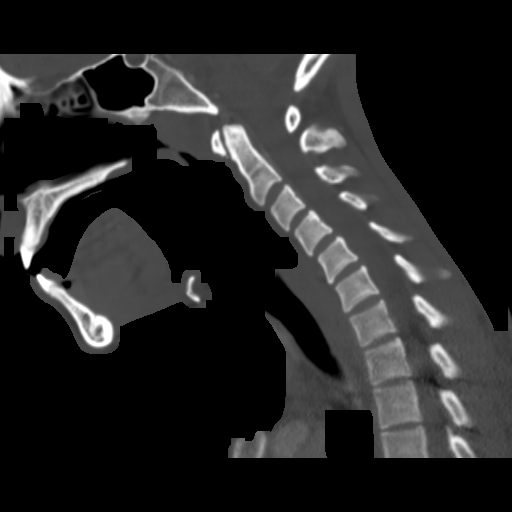
[im 36/62  bone]
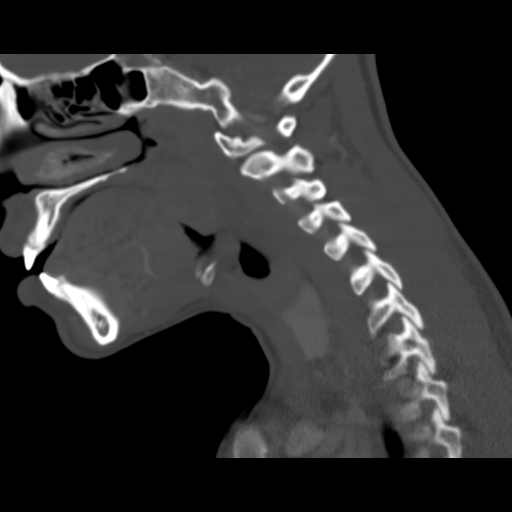
[im 41/62  bone]
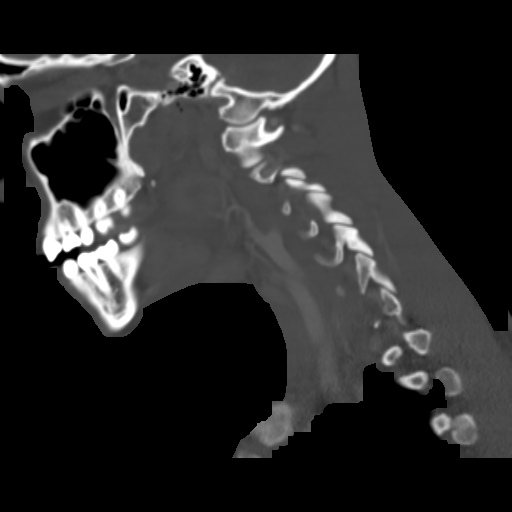

[Series 605: axial mpr · axial · 0.44mm/px · z∈[-225,-119]mm · 4 of 97 slices shown]
[im 20/97  bone]
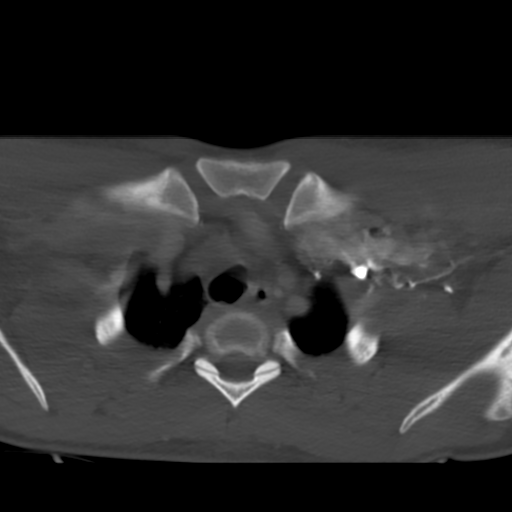
[im 39/97  bone]
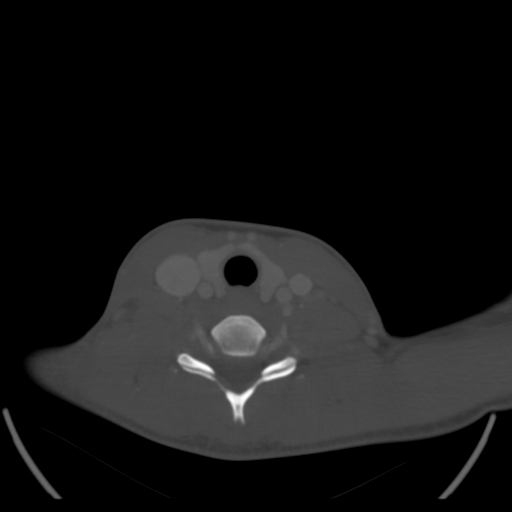
[im 58/97  bone]
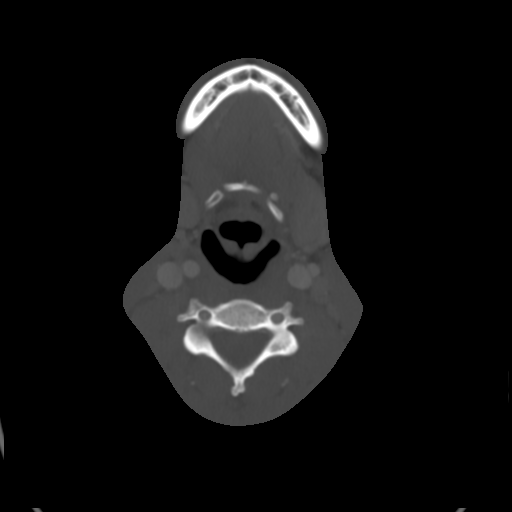
[im 77/97  bone]
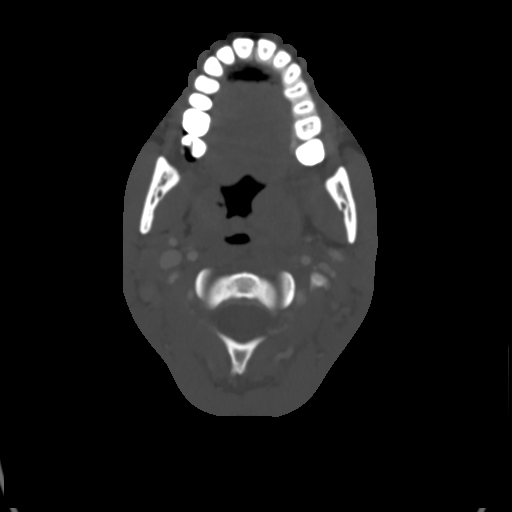

[16 of 33 positions shown; findings below may reference images not displayed]

FINDINGS: Orbits and sinuses are within normal. The spaces of the suprahyoid
neck are notable for prominence of the peritonsillar tissues left
worse than right with small left peritonsillar fluid
collection/abscess measuring 1.3 x 0.9 cm in its AP and transverse
dimension. There is mild reactive left cervical chain adenopathy
with the largest node measuring 1.5 cm by short axis. Airways
patent. There is subtle asymmetric enlargement of the left
submandibular gland. Spaces of the infrahyoid neck are within
normal. Visualize lungs are normal. The remainder of the exam is
unremarkable.
IMPRESSION: Prominence of the peritonsillar soft tissues left greater than right
with small focal fluid collection within the left peritonsillar
tissues measuring 1.3 x 0.9 cm likely a small peritonsillar abscess.
Mild associated reactive adenopathy of the left cervical chain. No
evidence of airway compromise.

## 2014-04-12 ENCOUNTER — Telehealth: Payer: Self-pay | Admitting: *Deleted

## 2014-04-12 NOTE — Telephone Encounter (Signed)
Patient has questions about her medication 2:04  LM on VM to CB

## 2014-04-13 NOTE — Telephone Encounter (Signed)
Patient returned call- 04/13/2014 3:27 Patient states she changed her OCP back in October due to AUB. Patient states she has had 21/2 weeks of bleeding on her new birth control pills. LM on VM to CB 5:30

## 2014-04-28 ENCOUNTER — Telehealth: Payer: Self-pay | Admitting: *Deleted

## 2014-04-28 NOTE — Telephone Encounter (Signed)
Patient is currently using Nordette and has had bleeding every day. Patient is on her cycle now and she does not want to continue with this pill. This is the second pill that this patient tried with continuous bleeding. What would be next?

## 2014-04-29 NOTE — Telephone Encounter (Signed)
Error

## 2014-05-12 NOTE — Telephone Encounter (Signed)
Please schedule patient appointment. Nonemergent

## 2014-05-13 ENCOUNTER — Emergency Department (HOSPITAL_COMMUNITY)
Admission: EM | Admit: 2014-05-13 | Discharge: 2014-05-13 | Disposition: A | Discharge status: Home / Self Care | Payer: 59 | Source: Home / Self Care | Attending: Family Medicine | Admitting: Family Medicine

## 2014-05-13 ENCOUNTER — Encounter (HOSPITAL_COMMUNITY): Payer: Self-pay | Admitting: Emergency Medicine

## 2014-05-13 DIAGNOSIS — J020 Streptococcal pharyngitis: Secondary | ICD-10-CM

## 2014-05-13 LAB — POCT RAPID STREP A: Streptococcus, Group A Screen (Direct): POSITIVE — AB

## 2014-05-13 MED ORDER — PENICILLIN G BENZATHINE 1200000 UNIT/2ML IM SUSP
1.2000 10*6.[IU] | Freq: Once | INTRAMUSCULAR | Status: AC
  Administered 2014-05-13: 1.2 10*6.[IU] via INTRAMUSCULAR

## 2014-05-13 MED ORDER — PENICILLIN G BENZATHINE 1200000 UNIT/2ML IM SUSP
INTRAMUSCULAR | Status: AC
  Filled 2014-05-13: qty 2

## 2014-05-13 NOTE — ED Provider Notes (Signed)
Medical screening examination/treatment/procedure(s) were performed by resident physician or non-physician practitioner and as supervising physician I was immediately available for consultation/collaboration.   Bow Buntyn DOUGLAS MD.   Shourya Macpherson D Amarri Michaelson, MD 05/13/14 2013 

## 2014-05-13 NOTE — ED Notes (Signed)
Onset yesterday of throat pain, reports clear drainage, frequently clearing from throat, no fever

## 2014-05-13 NOTE — ED Provider Notes (Signed)
CSN: [id number]     Arrival date & time 05/13/14  1640 History   First MD Initiated Contact with Patient 05/13/14 1741     Chief Complaint  Patient presents with  . Sore Throat   (Consider location/radiation/quality/duration/timing/severity/associated sxs/prior Treatment) HPI Comments: Reports herself to be otherwise healthy. Works as Emergency planning/management officerpolice officer.  Patient is a 23 y.o. female presenting with pharyngitis. The history is provided by the patient.  Sore Throat This is a new problem. The current episode started yesterday. The problem occurs constantly. The problem has not changed since onset.   Past Medical History  Diagnosis Date  . Skin disease   . Eczema   . History of chlamydia    Past Surgical History  Procedure Laterality Date  . Wisdom tooth extraction     Family History  Problem Relation Age of Onset  . Asthma Mother    History  Substance Use Topics  . Smoking status: Never Smoker   . Smokeless tobacco: Never Used  . Alcohol Use: Yes     Comment: 1-2 times a week   OB History   Grav Para Term Preterm Abortions TAB SAB Ect Mult Living   0 0 0 0 0 0 0 0 0 0      Review of Systems  All other systems reviewed and are negative.   Allergies  Review of patient's allergies indicates no known allergies.  Home Medications   Prior to Admission medications   Medication Sig Start Date End Date Taking? Authorizing Provider  hydrOXYzine (ATARAX/VISTARIL) 10 MG tablet Take 10 mg by mouth 3 (three) times daily as needed.   Yes Historical Provider, MD  levonorgestrel-ethinyl estradiol (NORDETTE) 0.15-30 MG-MCG tablet Take 1 tablet by mouth daily. 02/15/14   Antionette CharLisa Jackson-Moore, MD   BP 101/67  Pulse 97  Temp(Src) 98.2 F (36.8 C) (Oral)  Resp 16  SpO2 100%  LMP 04/12/2014 Physical Exam  Nursing note and vitals reviewed. Constitutional: She is oriented to person, place, and time. She appears well-developed and well-nourished. No distress.  HENT:  Head: Normocephalic  and atraumatic.  Right Ear: Hearing, tympanic membrane, external ear and ear canal normal.  Left Ear: Hearing, tympanic membrane, external ear and ear canal normal.  Nose: Nose normal.  Mouth/Throat: Uvula is midline, oropharynx is clear and moist and mucous membranes are normal. No oral lesions. No trismus in the jaw. No uvula swelling.  Eyes: Conjunctivae are normal. No scleral icterus.  Neck: Normal range of motion, full passive range of motion without pain and phonation normal. Neck supple.  Cardiovascular: Normal rate, regular rhythm and normal heart sounds.   Pulmonary/Chest: Effort normal and breath sounds normal.  Musculoskeletal: Normal range of motion.  Lymphadenopathy:    She has no cervical adenopathy.  Neurological: She is alert and oriented to person, place, and time.  Skin: Skin is warm and dry. No rash noted. No erythema.  Psychiatric: She has a normal mood and affect. Her behavior is normal.    ED Course  Procedures (including critical care time) Labs Review Labs Reviewed  POCT RAPID STREP A ([facility] CARE ONLY) - Abnormal; Notable for the following:    Streptococcus, Group A Screen (Direct) POSITIVE (*)    All other components within normal limits    Imaging Review No results found.   MDM   1. Strep pharyngitis    Rapid strep positive. Patient treated with Bicillin LA 1.2 MU Im at [facility] and advised to follow up if no improvement.  Ria Clock, [place] 05/13/14 [phone]

## 2014-05-13 NOTE — Discharge Instructions (Signed)

## 2014-06-09 ENCOUNTER — Encounter: Payer: Self-pay | Admitting: Obstetrics & Gynecology

## 2014-06-09 ENCOUNTER — Ambulatory Visit (INDEPENDENT_AMBULATORY_CARE_PROVIDER_SITE_OTHER): Payer: 59 | Admitting: Obstetrics & Gynecology

## 2014-06-09 ENCOUNTER — Other Ambulatory Visit: Payer: Self-pay | Admitting: Obstetrics & Gynecology

## 2014-06-09 VITALS — BP 114/69 | HR 81 | Temp 98.4°F | Wt 132.0 lb

## 2014-06-09 DIAGNOSIS — Z113 Encounter for screening for infections with a predominantly sexual mode of transmission: Secondary | ICD-10-CM

## 2014-06-09 DIAGNOSIS — N898 Other specified noninflammatory disorders of vagina: Secondary | ICD-10-CM

## 2014-06-09 DIAGNOSIS — Z3009 Encounter for other general counseling and advice on contraception: Secondary | ICD-10-CM

## 2014-06-09 LAB — POCT URINE PREGNANCY: Preg Test, Ur: NEGATIVE

## 2014-06-09 MED ORDER — METRONIDAZOLE 500 MG PO TABS: 500.0000 mg | ORAL_TABLET | Freq: Two times a day (BID) | ORAL | Status: DC

## 2014-06-09 NOTE — Addendum Note (Signed)
Addended by: Odessa Fleming on: 06/09/2014 03:17 PM   Modules accepted: Orders

## 2014-06-09 NOTE — Patient Instructions (Signed)
Safe Sex Safe sex is about reducing the risk of giving or getting a sexually transmitted disease (STD). STDs are spread through sexual contact involving the genitals, mouth, or rectum. Some STDs can be cured and others cannot. Safe sex can also prevent unintended pregnancies.  WHAT ARE SOME SAFE SEX PRACTICES?  Limit your sexual activity to only one partner who is having sex with only you.  Talk to your partner about his or her past partners, past STDs, and drug use.  Use a condom every time you have sexual intercourse. This includes vaginal, oral, and anal sexual activity. Both females and males should wear condoms during oral sex. Only use latex or polyurethane condoms and water-based lubricants. Using petroleum-based lubricants or oils to lubricate a condom will weaken the condom and increase the chance that it will break. The condom should be in place from the beginning to the end of sexual activity. Wearing a condom reduces, but does not completely eliminate, your risk of getting or giving an STD. STDs can be spread by contact with infected body fluids and skin.  Get vaccinated for hepatitis B and HPV.  Avoid alcohol and recreational drugs, which can affect your judgment. You may forget to use a condom or participate in high-risk sex.  For females, avoid douching after sexual intercourse. Douching can spread an infection farther into the reproductive tract.  Check your body for signs of sores, blisters, rashes, or unusual discharge. See your health care provider if you notice any of these signs.  Avoid sexual contact if you have symptoms of an infection or are being treated for an STD. If you or your partner has herpes, avoid sexual contact when blisters are present. Use condoms at all other times.  If you are at risk of being infected with HIV, it is recommended that you take a prescription medicine daily to prevent HIV infection. This is called pre-exposure prophylaxis (PrEP). You are  considered at risk if:  You are a man who has sex with other men (MSM).  You are a heterosexual man or woman who is sexually active with more than one partner.  You take drugs by injection.  You are sexually active with a partner who has HIV.  Talk with your health care provider about whether you are at high risk of being infected with HIV. If you choose to begin PrEP, you should first be tested for HIV. You should then be tested every 3 months for as long as you are taking PrEP.  See your health care provider for regular screenings, exams, and tests for other STDs. Before having sex with a new partner, each of you should be screened for STDs and should talk about the results with each other. WHAT ARE THE BENEFITS OF SAFE SEX?   There is less chance of getting or giving an STD.  You can prevent unwanted or unintended pregnancies.  By discussing safe sex concerns with your partner, you may increase feelings of intimacy, comfort, trust, and honesty between the two of you. Document Released: 10/25/2004 Document Revised: 02/01/2014 Document Reviewed: 03/10/2012 [facility] Patient Information 2015 [place], [place]. This information is not intended to replace advice given to you by your health care provider. Make sure you discuss any questions you have with your health care provider. Vaginitis Vaginitis is an inflammation of the vagina. It is most often caused by a change in the normal balance of the bacteria and yeast that live in the vagina. This change in balance causes an  overgrowth of certain bacteria or yeast, which causes the inflammation. There are different types of vaginitis, but the most common types are:  Bacterial vaginosis.  Yeast infection (candidiasis).  Trichomoniasis vaginitis. This is a sexually transmitted infection (STI).  Viral vaginitis.  Atropic vaginitis.  Allergic vaginitis. CAUSES  The cause depends on the type of vaginitis. Vaginitis can be caused  by:  Bacteria (bacterial vaginosis).  Yeast (yeast infection).  A parasite (trichomoniasis vaginitis)  A virus (viral vaginitis).  Low hormone levels (atrophic vaginitis). Low hormone levels can occur during pregnancy, breastfeeding, or after menopause.  Irritants, such as bubble baths, scented tampons, and feminine sprays (allergic vaginitis). Other factors can change the normal balance of the yeast and bacteria that live in the vagina. These include:  Antibiotic medicines.  Poor hygiene.  Diaphragms, vaginal sponges, spermicides, birth control pills, and intrauterine devices (IUD).  Sexual intercourse.  Infection.  Uncontrolled diabetes.  A weakened immune system. SYMPTOMS  Symptoms can vary depending on the cause of the vaginitis. Common symptoms include:  Abnormal vaginal discharge.  The discharge is white, gray, or yellow with bacterial vaginosis.  The discharge is thick, white, and cheesy with a yeast infection.  The discharge is frothy and yellow or greenish with trichomoniasis.  A bad vaginal odor.  The odor is fishy with bacterial vaginosis.  Vaginal itching, pain, or swelling.  Painful intercourse.  Pain or burning when urinating. Sometimes, there are no symptoms. TREATMENT  Treatment will vary depending on the type of infection.   Bacterial vaginosis and trichomoniasis are often treated with antibiotic creams or pills.  Yeast infections are often treated with antifungal medicines, such as vaginal creams or suppositories.  Viral vaginitis has no cure, but symptoms can be treated with medicines that relieve discomfort. Your sexual partner should be treated as well.  Atrophic vaginitis may be treated with an estrogen cream, pill, suppository, or vaginal ring. If vaginal dryness occurs, lubricants and moisturizing creams may help. You may be told to avoid scented soaps, sprays, or douches.  Allergic vaginitis treatment involves quitting the use of  the product that is causing the problem. Vaginal creams can be used to treat the symptoms. HOME CARE INSTRUCTIONS   Take all medicines as directed by your caregiver.  Keep your genital area clean and dry. Avoid soap and only rinse the area with water.  Avoid douching. It can remove the healthy bacteria in the vagina.  Do not use tampons or have sexual intercourse until your vaginitis has been treated. Use sanitary pads while you have vaginitis.  Wipe from front to back. This avoids the spread of bacteria from the rectum to the vagina.  Let air reach your genital area.  Wear cotton underwear to decrease moisture buildup.  Avoid wearing underwear while you sleep until your vaginitis is gone.  Avoid tight pants and underwear or nylons without a cotton panel.  Take off wet clothing (especially bathing suits) as soon as possible.  Use mild, non-scented products. Avoid using irritants, such as:  Scented feminine sprays.  Fabric softeners.  Scented detergents.  Scented tampons.  Scented soaps or bubble baths.  Practice safe sex and use condoms. Condoms may prevent the spread of trichomoniasis and viral vaginitis. SEEK MEDICAL CARE IF:   You have abdominal pain.  You have a fever or persistent symptoms for more than 2-3 days.  You have a fever and your symptoms suddenly get worse. Document Released: 07/15/2007 Document Revised: 06/11/2012 Document Reviewed: 02/28/2012 [employer] Patient Information 2015  [employer]. This information is not intended to replace advice given to you by your health care provider. Make sure you discuss any questions you have with your health care provider. Intrauterine Device Information An intrauterine device (IUD) is inserted into your uterus to prevent pregnancy. There are two types of IUDs available:   Copper IUD--This type of IUD is wrapped in copper wire and is placed inside the uterus. Copper makes the uterus and fallopian tubes produce a  fluid that kills sperm. The copper IUD can stay in place for 10 years.  Hormone IUD--This type of IUD contains the hormone progestin (synthetic progesterone). The hormone thickens the cervical mucus and prevents sperm from entering the uterus. It also thins the uterine lining to prevent implantation of a fertilized egg. The hormone can weaken or kill the sperm that get into the uterus. One type of hormone IUD can stay in place for 5 years, and another type can stay in place for 3 years. Your health care provider will make sure you are a good candidate for a contraceptive IUD. Discuss with your health care provider the possible side effects.  ADVANTAGES OF AN INTRAUTERINE DEVICE  IUDs are highly effective, reversible, long acting, and low maintenance.   There are no estrogen-related side effects.   An IUD can be used when breastfeeding.   IUDs are not associated with weight gain.   The copper IUD works immediately after insertion.   The hormone IUD works right away if inserted within 7 days of your period starting. You will need to use a backup method of birth control for 7 days if the hormone IUD is inserted at any other time in your cycle.  The copper IUD does not interfere with your female hormones.   The hormone IUD can make heavy menstrual periods lighter and decrease cramping.   The hormone IUD can be used for 3 or 5 years.   The copper IUD can be used for 10 years. DISADVANTAGES OF AN INTRAUTERINE DEVICE  The hormone IUD can be associated with irregular bleeding patterns.   The copper IUD can make your menstrual flow heavier and more painful.   You may experience cramping and vaginal bleeding after insertion.  Document Released: 08/21/2004 Document Revised: 05/20/2013 Document Reviewed: 03/08/2013 [facility] Patient Information 2015 [place], [place]. This information is not intended to replace advice given to you by your health care provider. Make sure you discuss any  questions you have with your health care provider.

## 2014-06-09 NOTE — Progress Notes (Signed)
Patient ID: Monique Harrington, female   DOB: 19-Sep-1991, 23 y.o.   MRN: [id number]  Chief Complaint  Patient presents with  . Vaginitis    vaginal d/c with odor    HPI Monique Harrington is a 23 y.o. female.   Vaginal Discharge The patient's primary symptoms include a vaginal discharge. Nothing aggravates the symptoms. She has tried nothing for the symptoms. She is sexually active. No, her partner does not have an STD. She uses nothing for contraception. Her menstrual history has been irregular. Her past medical history is significant for an abdominal surgery. There is no history of a gynecological surgery.    Past Medical History  Diagnosis Date  . Skin disease   . Eczema   . History of chlamydia     Past Surgical History  Procedure Laterality Date  . Wisdom tooth extraction      Family History  Problem Relation Age of Onset  . Asthma Mother     Social History History  Substance Use Topics  . Smoking status: Never Smoker   . Smokeless tobacco: Never Used  . Alcohol Use: Yes     Comment: 1-2 times a week    No Known Allergies  Current Outpatient Prescriptions  Medication Sig Dispense Refill  . hydrOXYzine (ATARAX/VISTARIL) 10 MG tablet Take 10 mg by mouth 3 (three) times daily as needed.      Marland Kitchen levonorgestrel-ethinyl estradiol (NORDETTE) 0.15-30 MG-MCG tablet Take 1 tablet by mouth daily.  1 Package  11   No current facility-administered medications for this visit.    Review of Systems Review of Systems  Genitourinary: Positive for vaginal discharge.   Constitutional: negative for fatigue and weight loss Respiratory: negative for cough and wheezing Cardiovascular: negative for chest pain, fatigue and palpitations Gastrointestinal: negative for abdominal pain and change in bowel habits Integument/breast: negative for nipple discharge Musculoskeletal:negative for myalgias Neurological: negative for gait problems and tremors Behavioral/Psych: negative for abusive  relationship, depression Endocrine: negative for temperature intolerance     Blood pressure 114/69, pulse 81, temperature 98.4 F (36.9 C), weight 59.875 kg (132 lb), last menstrual period 05/31/2014.  Physical Exam Physical Exam General:   Alert, oriented x 4, well-developed, well-nourished female  Skin:   no rash or abnormalities  Lungs:   clear to auscultation bilaterally  Heart:   regular rate and rhythm, S1, S2 normal, no murmur, click, rub or gallop  Breasts:   normal without suspicious masses, skin or nipple changes or axillary nodes  Abdomen:  normal findings: no organomegaly, soft, non-tender and no hernia  Pelvis:  External genitalia: normal general appearance Urinary system: urethral meatus normal and bladder without fullness, nontender Vaginal: thin, white discharge Cervix: normal appearance Adnexa: normal bimanual exam Uterus: anteverted and non-tender, normal size      Data Reviewed None  Assessment  Likely Bacterial vaginosis    Plan * Return for [facility] placement. * STD testing today. *Offered Flu vaccine and patient declined *Instructed patient to notify provider with any questions, comments, concerns or problems.   Meds ordered this encounter  Medications  . metroNIDAZOLE (FLAGYL) 500 MG tablet    Sig: Take 1 tablet (500 mg total) by mouth 2 (two) times daily. For 7 days    Dispense:  14 tablet    Refill:  0   Orders Placed This Encounter  Procedures  . POCT urine pregnancy          JACKSON-MOORE,Mery Guadalupe A 06/09/2014, 11:49 AM

## 2014-06-10 ENCOUNTER — Telehealth: Payer: Self-pay | Admitting: *Deleted

## 2014-06-10 LAB — WET PREP BY MOLECULAR PROBE
Candida species: NEGATIVE
Gardnerella vaginalis: POSITIVE — AB
Trichomonas vaginosis: NEGATIVE

## 2014-06-10 LAB — RPR

## 2014-06-10 LAB — GC/CHLAMYDIA PROBE AMP
CT PROBE, AMP APTIMA: NEGATIVE
GC PROBE AMP APTIMA: NEGATIVE

## 2014-06-10 LAB — HIV ANTIBODY (ROUTINE TESTING W REFLEX): HIV 1&2 Ab, 4th Generation: NONREACTIVE

## 2014-06-10 NOTE — Telephone Encounter (Signed)
Call placed to pt to schedule appt for Skyla insertion.  Pt is scheduled for 06-23-14.  Pt also made aware of lab results and positive BV.  Treatment was sent to pharmacy at time of visit. Pt advised to contact office with any other concerns.

## 2014-06-23 ENCOUNTER — Ambulatory Visit: Payer: Self-pay | Admitting: Obstetrics & Gynecology

## 2014-06-24 ENCOUNTER — Ambulatory Visit: Payer: Self-pay | Admitting: Obstetrics & Gynecology

## 2014-07-22 ENCOUNTER — Telehealth: Payer: Self-pay | Admitting: *Deleted

## 2014-07-22 ENCOUNTER — Ambulatory Visit: Payer: Self-pay | Admitting: Obstetrics & Gynecology

## 2014-07-22 NOTE — Telephone Encounter (Signed)
Patient contacted the office to reschedule her IUD Insertion. Attempted to contact the patient to reschedule and left message for patient to contact the office.

## 2014-07-30 NOTE — Telephone Encounter (Signed)
Left message on voicemail for patient to contact the office when she is ready to schedule her IUD insertion

## 2014-09-27 ENCOUNTER — Encounter: Payer: Self-pay | Admitting: *Deleted

## 2014-09-28 ENCOUNTER — Encounter: Payer: Self-pay | Admitting: Obstetrics & Gynecology

## 2014-11-19 ENCOUNTER — Encounter: Payer: Self-pay | Admitting: Certified Nurse Midwife

## 2014-11-19 ENCOUNTER — Ambulatory Visit (INDEPENDENT_AMBULATORY_CARE_PROVIDER_SITE_OTHER): Payer: 59 | Admitting: Certified Nurse Midwife

## 2014-11-19 VITALS — BP 100/66 | HR 84 | Temp 98.4°F | Ht 65.0 in | Wt 140.0 lb

## 2014-11-19 DIAGNOSIS — A499 Bacterial infection, unspecified: Secondary | ICD-10-CM | POA: Diagnosis not present

## 2014-11-19 DIAGNOSIS — N760 Acute vaginitis: Secondary | ICD-10-CM

## 2014-11-19 DIAGNOSIS — N898 Other specified noninflammatory disorders of vagina: Secondary | ICD-10-CM | POA: Diagnosis not present

## 2014-11-19 DIAGNOSIS — B9689 Other specified bacterial agents as the cause of diseases classified elsewhere: Secondary | ICD-10-CM

## 2014-11-19 LAB — POCT URINE PREGNANCY: Preg Test, Ur: NEGATIVE

## 2014-11-19 MED ORDER — METRONIDAZOLE 500 MG PO TABS: 500.0000 mg | ORAL_TABLET | Freq: Two times a day (BID) | ORAL | Status: AC

## 2014-11-19 NOTE — Progress Notes (Signed)
Patient ID: Monique Harrington, female   DOB: 04/07/1991, 24 y.o.   MRN: [id number]   Chief Complaint  Patient presents with  . Problem    BV    HPI Monique Harrington is a 24 y.o. female.  Complains of vaginal discharge with odor for about two weeks, has been using tampons off of cycle to control the smell. Increased "fishy" smell discharge at end of the day  HPI  Past Medical History  Diagnosis Date  . Skin disease   . Eczema   . History of chlamydia     Past Surgical History  Procedure Laterality Date  . Wisdom tooth extraction      Family History  Problem Relation Age of Onset  . Asthma Mother     Social History History  Substance Use Topics  . Smoking status: Never Smoker   . Smokeless tobacco: Never Used  . Alcohol Use: Yes     Comment: 1-2 times a week    No Known Allergies  Current Outpatient Prescriptions  Medication Sig Dispense Refill  . hydrOXYzine (ATARAX/VISTARIL) 10 MG tablet Take 10 mg by mouth 3 (three) times daily as needed.    Marland Kitchen. levonorgestrel-ethinyl estradiol (NORDETTE) 0.15-30 MG-MCG tablet Take 1 tablet by mouth daily. 1 Package 11  . metroNIDAZOLE (FLAGYL) 500 MG tablet Take 1 tablet (500 mg total) by mouth 2 (two) times daily. For 7 days 14 tablet 0   No current facility-administered medications for this visit.    Review of Systems Review of Systems Constitutional: negative for fatigue and weight loss Respiratory: negative for cough and wheezing Cardiovascular: negative for chest pain, fatigue and palpitations Gastrointestinal: negative for abdominal pain and change in bowel habits Genitourinary:negative Integument/breast: negative for nipple discharge Musculoskeletal:negative for myalgias Neurological: negative for gait problems and tremors Behavioral/Psych: negative for abusive relationship, depression Endocrine: negative for temperature intolerance     Blood pressure 100/66, pulse 84, temperature 98.4 F (36.9 C), height 5\' 5"   (1.651 m), weight 63.504 kg (140 lb), last menstrual period 11/10/2014.  Physical Exam Physical Exam General:   alert  Skin:   no rash or abnormalities  Lungs:   clear to auscultation bilaterally  Heart:   regular rate and rhythm, S1, S2 normal, no murmur, click, rub or gallop  Breasts:   normal without suspicious masses, skin or nipple changes or axillary nodes  Abdomen:  normal findings: no organomegaly, soft, non-tender and no hernia  Pelvis:  External genitalia: normal general appearance Urinary system: urethral meatus normal and bladder without fullness, nontender Vaginal: normal without tenderness, induration or masses Cervix: normal appearance Adnexa: normal bimanual exam Uterus: anteverted and non-tender, normal size    50% of 15 min visit spent on counseling and coordination of care.   Data Reviewed Labs, pervious medical hx  Assessment     Vaginitis     Plan    No orders of the defined types were placed in this encounter.   Meds ordered this encounter  Medications  . metroNIDAZOLE (FLAGYL) 500 MG tablet    Sig: Take 1 tablet (500 mg total) by mouth 2 (two) times daily. For 7 days    Dispense:  14 tablet    Refill:  0   Educated on not using tampons to control the smell, along with perineal hygiene.    Insertion of Skyla scheduled for next menstrual period.   Possible options include: treating chronic BV with metrogel 0.75% cream twice weekly for 6 months.

## 2014-11-19 NOTE — Addendum Note (Signed)
Addended by: Odessa FlemingBOHNE, Khloi Rawl M on: 11/19/2014 01:28 PM   Modules accepted: Orders

## 2014-11-19 NOTE — Addendum Note (Signed)
Addended by: Odessa FlemingBOHNE, Chanze Teagle M on: 11/19/2014 01:40 PM   Modules accepted: Orders

## 2014-11-23 LAB — SURESWAB, VAGINOSIS/VAGINITIS PLUS
ATOPOBIUM VAGINAE: 7.3 Log (cells/mL)
C. TRACHOMATIS RNA, TMA: NOT DETECTED
C. albicans, DNA: NOT DETECTED
C. glabrata, DNA: NOT DETECTED
C. parapsilosis, DNA: NOT DETECTED
C. tropicalis, DNA: NOT DETECTED
Gardnerella vaginalis: >8 Log (cells/mL)
LACTOBACILLUS SPECIES: NOT DETECTED Log (cells/mL)
MEGASPHAERA SPECIES: 8 Log (cells/mL)
N. GONORRHOEAE RNA, TMA: NOT DETECTED
T. vaginalis RNA, QL TMA: NOT DETECTED

## 2014-12-01 ENCOUNTER — Other Ambulatory Visit: Payer: Self-pay | Admitting: *Deleted

## 2014-12-01 DIAGNOSIS — N760 Acute vaginitis: Secondary | ICD-10-CM

## 2014-12-01 MED ORDER — CLINDAMYCIN PHOSPHATE (1 DOSE) 2 % VA CREA: TOPICAL_CREAM | VAGINAL | Status: AC

## 2014-12-07 ENCOUNTER — Ambulatory Visit: Payer: Self-pay | Admitting: Certified Nurse Midwife

## 2015-02-23 ENCOUNTER — Ambulatory Visit: Payer: Self-pay | Admitting: Obstetrics & Gynecology

## 2016-12-14 ENCOUNTER — Ambulatory Visit (HOSPITAL_COMMUNITY)
Admission: EM | Admit: 2016-12-14 | Discharge: 2016-12-14 | Disposition: A | Discharge status: Home / Self Care | Payer: Self-pay | Attending: Family Medicine | Admitting: Family Medicine

## 2016-12-14 ENCOUNTER — Encounter (HOSPITAL_COMMUNITY): Payer: Self-pay | Admitting: Emergency Medicine

## 2016-12-14 DIAGNOSIS — R110 Nausea: Secondary | ICD-10-CM

## 2016-12-14 DIAGNOSIS — A084 Viral intestinal infection, unspecified: Secondary | ICD-10-CM

## 2016-12-14 MED ORDER — ONDANSETRON 4 MG PO TBDP
ORAL_TABLET | ORAL | Status: AC
  Filled 2016-12-14: qty 1

## 2016-12-14 MED ORDER — ONDANSETRON 4 MG PO TBDP
4.0000 mg | ORAL_TABLET | Freq: Once | ORAL | Status: AC
Start: 2016-12-14 — End: 2016-12-14
  Administered 2016-12-14: 4 mg via ORAL

## 2016-12-14 MED ORDER — ONDANSETRON 4 MG PO TBDP: 4.0000 mg | ORAL_TABLET | Freq: Three times a day (TID) | ORAL | 0 refills | Status: AC | PRN

## 2016-12-14 NOTE — ED Provider Notes (Signed)
CSN: [id number]     Arrival date & time 12/14/16  1908 History   None    Chief Complaint  Patient presents with  . Abdominal Pain   (Consider location/radiation/quality/duration/timing/severity/associated sxs/prior Treatment) Patient c/o Nausea and vomiting today   The history is provided by the patient and a parent.  Abdominal Pain  Pain location:  Epigastric Pain quality: aching   Pain radiates to:  Does not radiate Pain severity:  Mild Onset quality:  Sudden Duration:  1 day Timing:  Constant Chronicity:  New Relieved by:  Nothing Worsened by:  Nothing Associated symptoms: nausea     Past Medical History:  Diagnosis Date  . Eczema   . History of chlamydia   . Skin disease    Past Surgical History:  Procedure Laterality Date  . WISDOM TOOTH EXTRACTION     Family History  Problem Relation Age of Onset  . Asthma Mother    Social History  Substance Use Topics  . Smoking status: Never Smoker  . Smokeless tobacco: Never Used  . Alcohol use Yes     Comment: 1-2 times a week   OB History    Gravida Para Term Preterm AB Living   0 0 0 0 0 0   SAB TAB Ectopic Multiple Live Births   0 0 0 0       Review of Systems  Constitutional: Negative.   HENT: Negative.   Eyes: Negative.   Respiratory: Negative.   Cardiovascular: Negative.   Gastrointestinal: Positive for abdominal pain and nausea.  Endocrine: Negative.   Genitourinary: Negative.   Musculoskeletal: Negative.   Allergic/Immunologic: Negative.   Neurological: Negative.   Hematological: Negative.   Psychiatric/Behavioral: Negative.     Allergies  Patient has no known allergies.  Home Medications   Prior to Admission medications   Medication Sig Start Date End Date Taking? Authorizing Provider  Clindamycin Phosphate, 1 Dose, (CLINDESSE) vaginal cream 1 Applicatorful once intravaginally 12/01/14   Brock Badharles A Harper, MD  hydrOXYzine (ATARAX/VISTARIL) 10 MG tablet Take 10 mg by mouth 3 (three) times  daily as needed.    Historical Provider, MD  levonorgestrel-ethinyl estradiol (NORDETTE) 0.15-30 MG-MCG tablet Take 1 tablet by mouth daily. 02/15/14   Antionette CharLisa Jackson-Moore, MD  metroNIDAZOLE (FLAGYL) 500 MG tablet Take 1 tablet (500 mg total) by mouth 2 (two) times daily. For 7 days 11/19/14   Rachelle A Denney, CNM  ondansetron (ZOFRAN ODT) 4 MG disintegrating tablet Take 1 tablet (4 mg total) by mouth every 8 (eight) hours as needed for nausea or vomiting. 12/14/16   Deatra CanterWilliam J Timmothy Baranowski, FNP   Meds Ordered and Administered this Visit   Medications  ondansetron (ZOFRAN-ODT) disintegrating tablet 4 mg (4 mg Oral Given 12/14/16 1949)    BP 104/62 (BP Location: Left Arm)   Pulse 99   Temp 98.8 F (37.1 C) (Oral)   Resp 20   LMP 12/01/2016   SpO2 99%  No data found.   Physical Exam  Constitutional: She is oriented to person, place, and time. She appears well-developed and well-nourished.  HENT:  Head: Normocephalic.  Right Ear: External ear normal.  Left Ear: External ear normal.  Mouth/Throat: Oropharynx is clear and moist.  Eyes: Conjunctivae and EOM are normal. Pupils are equal, round, and reactive to light.  Neck: Normal range of motion. Neck supple.  Cardiovascular: Normal rate, regular rhythm and normal heart sounds.   Pulmonary/Chest: Effort normal and breath sounds normal.  Abdominal: Soft.  Neurological: She is alert  and oriented to person, place, and time.  Nursing note and vitals reviewed.   Urgent Care Course     Procedures (including critical care time)  Labs Review Labs Reviewed - No data to display  Imaging Review No results found.   Visual Acuity Review  Right Eye Distance:   Left Eye Distance:   Bilateral Distance:    Right Eye Near:   Left Eye Near:    Bilateral Near:         MDM   1. Nausea   2. Viral gastroenteritis    Zofran odt 4mg  one po tid prn #21  Push po fluids, rest, tylenol and motrin otc prn as directed for fever, arthralgias,  and myalgias.  Follow up prn if sx's continue or persist.    Deatra Canter, FNP 12/14/16 2011

## 2016-12-14 NOTE — ED Triage Notes (Signed)
Here for intermittent abd pain onset last night associated w/diarrhea, vomiting, nausea, fevers  Denies urinary sx  Reports coworkers sick w/similar sx.   A&O x4... NAD
